# Patient Record
Sex: Male | Born: 1965 | Race: White | Hispanic: No | Marital: Married | Smoking: Former smoker
Health system: Southern US, Community
[De-identification: ages and names within clinical notes are randomized; demographics above are authoritative.]

## PROBLEM LIST (undated history)

## (undated) DIAGNOSIS — I10 Essential (primary) hypertension: Secondary | ICD-10-CM

## (undated) DIAGNOSIS — N529 Male erectile dysfunction, unspecified: Secondary | ICD-10-CM

## (undated) DIAGNOSIS — T7840XA Allergy, unspecified, initial encounter: Secondary | ICD-10-CM

## (undated) DIAGNOSIS — G4733 Obstructive sleep apnea (adult) (pediatric): Secondary | ICD-10-CM

## (undated) DIAGNOSIS — J302 Other seasonal allergic rhinitis: Secondary | ICD-10-CM

## (undated) DIAGNOSIS — G4730 Sleep apnea, unspecified: Secondary | ICD-10-CM

## (undated) DIAGNOSIS — F419 Anxiety disorder, unspecified: Secondary | ICD-10-CM

## (undated) DIAGNOSIS — K5190 Ulcerative colitis, unspecified, without complications: Secondary | ICD-10-CM

## (undated) DIAGNOSIS — M1990 Unspecified osteoarthritis, unspecified site: Secondary | ICD-10-CM

## (undated) HISTORY — DX: Male erectile dysfunction, unspecified: N52.9

## (undated) HISTORY — DX: Allergy, unspecified, initial encounter: T78.40XA

## (undated) HISTORY — PX: KNEE SURGERY: SHX244

## (undated) HISTORY — DX: Sleep apnea, unspecified: G47.30

## (undated) HISTORY — PX: TONSILLECTOMY AND ADENOIDECTOMY: SHX28

## (undated) HISTORY — DX: Essential (primary) hypertension: I10

## (undated) HISTORY — DX: Other seasonal allergic rhinitis: J30.2

## (undated) HISTORY — DX: Unspecified osteoarthritis, unspecified site: M19.90

## (undated) HISTORY — PX: COLONOSCOPY: SHX174

## (undated) HISTORY — PX: OTHER SURGICAL HISTORY: SHX169

## (undated) HISTORY — DX: Obstructive sleep apnea (adult) (pediatric): G47.33

## (undated) HISTORY — DX: Ulcerative colitis, unspecified, without complications: K51.90

## (undated) HISTORY — PX: APPENDECTOMY: SHX54

## (undated) HISTORY — DX: Anxiety disorder, unspecified: F41.9

---

## 2001-08-23 ENCOUNTER — Emergency Department (HOSPITAL_COMMUNITY): Admission: EM | Admit: 2001-08-23 | Discharge: 2001-08-23 | Payer: Self-pay

## 2001-09-01 ENCOUNTER — Encounter: Payer: Self-pay | Admitting: Internal Medicine

## 2001-09-07 ENCOUNTER — Encounter: Payer: Self-pay | Admitting: Internal Medicine

## 2001-09-07 ENCOUNTER — Encounter (INDEPENDENT_AMBULATORY_CARE_PROVIDER_SITE_OTHER): Payer: Self-pay | Admitting: Specialist

## 2001-09-07 ENCOUNTER — Ambulatory Visit (HOSPITAL_COMMUNITY): Admission: RE | Admit: 2001-09-07 | Discharge: 2001-09-07 | Payer: Self-pay | Admitting: Internal Medicine

## 2001-09-19 ENCOUNTER — Encounter: Payer: Self-pay | Admitting: Internal Medicine

## 2001-10-31 ENCOUNTER — Encounter: Payer: Self-pay | Admitting: Internal Medicine

## 2001-12-20 ENCOUNTER — Encounter: Payer: Self-pay | Admitting: Internal Medicine

## 2002-11-30 ENCOUNTER — Encounter: Payer: Self-pay | Admitting: Internal Medicine

## 2003-01-30 ENCOUNTER — Encounter: Payer: Self-pay | Admitting: Internal Medicine

## 2003-05-18 ENCOUNTER — Ambulatory Visit (HOSPITAL_BASED_OUTPATIENT_CLINIC_OR_DEPARTMENT_OTHER): Admission: RE | Admit: 2003-05-18 | Discharge: 2003-05-18 | Payer: Self-pay | Admitting: Family Medicine

## 2003-06-26 ENCOUNTER — Encounter: Payer: Self-pay | Admitting: Internal Medicine

## 2003-07-25 ENCOUNTER — Encounter: Payer: Self-pay | Admitting: Internal Medicine

## 2003-09-19 ENCOUNTER — Encounter: Payer: Self-pay | Admitting: Internal Medicine

## 2004-01-17 ENCOUNTER — Encounter: Payer: Self-pay | Admitting: Internal Medicine

## 2004-02-28 ENCOUNTER — Encounter: Payer: Self-pay | Admitting: Internal Medicine

## 2004-04-14 ENCOUNTER — Ambulatory Visit: Payer: Self-pay | Admitting: Internal Medicine

## 2004-04-23 ENCOUNTER — Ambulatory Visit: Payer: Self-pay | Admitting: Internal Medicine

## 2004-05-09 ENCOUNTER — Ambulatory Visit: Payer: Self-pay | Admitting: Internal Medicine

## 2004-05-21 ENCOUNTER — Ambulatory Visit: Payer: Self-pay | Admitting: Internal Medicine

## 2004-06-09 ENCOUNTER — Ambulatory Visit: Payer: Self-pay | Admitting: Internal Medicine

## 2004-06-10 ENCOUNTER — Encounter: Payer: Self-pay | Admitting: Internal Medicine

## 2004-06-27 ENCOUNTER — Ambulatory Visit: Payer: Self-pay | Admitting: Internal Medicine

## 2004-07-09 ENCOUNTER — Ambulatory Visit: Payer: Self-pay | Admitting: Internal Medicine

## 2004-08-06 ENCOUNTER — Ambulatory Visit: Payer: Self-pay | Admitting: Internal Medicine

## 2004-09-08 ENCOUNTER — Ambulatory Visit: Payer: Self-pay | Admitting: Internal Medicine

## 2004-10-22 ENCOUNTER — Ambulatory Visit: Payer: Self-pay | Admitting: Internal Medicine

## 2005-01-07 ENCOUNTER — Ambulatory Visit: Payer: Self-pay | Admitting: Internal Medicine

## 2005-04-15 ENCOUNTER — Ambulatory Visit: Payer: Self-pay | Admitting: Internal Medicine

## 2005-07-03 ENCOUNTER — Ambulatory Visit: Payer: Self-pay | Admitting: Internal Medicine

## 2007-04-01 ENCOUNTER — Ambulatory Visit: Payer: Self-pay | Admitting: Internal Medicine

## 2007-04-01 LAB — CONVERTED CEMR LAB
ALT: 37 units/L (ref 0–53)
AST: 22 units/L (ref 0–37)
Alkaline Phosphatase: 60 units/L (ref 39–117)
BUN: 9 mg/dL (ref 6–23)
Basophils Relative: 0.6 % (ref 0.0–1.0)
Bilirubin, Direct: 0.1 mg/dL (ref 0.0–0.3)
CO2: 30 meq/L (ref 19–32)
Calcium: 8.9 mg/dL (ref 8.4–10.5)
Chloride: 105 meq/L (ref 96–112)
Creatinine, Ser: 0.9 mg/dL (ref 0.4–1.5)
Eosinophils Relative: 5.1 % — ABNORMAL HIGH (ref 0.0–5.0)
GFR calc Af Amer: 119 mL/min
Glucose, Bld: 111 mg/dL — ABNORMAL HIGH (ref 70–99)
Lymphocytes Relative: 28.7 % (ref 12.0–46.0)
Monocytes Relative: 8.1 % (ref 3.0–11.0)
Neutro Abs: 3.2 10*3/uL (ref 1.4–7.7)
Platelets: 174 10*3/uL (ref 150–400)
RBC: 4.76 M/uL (ref 4.22–5.81)
RDW: 12.3 % (ref 11.5–14.6)
Total Protein: 6.7 g/dL (ref 6.0–8.3)
WBC: 5.6 10*3/uL (ref 4.5–10.5)

## 2007-05-26 DIAGNOSIS — K5100 Ulcerative (chronic) pancolitis without complications: Secondary | ICD-10-CM

## 2007-05-26 DIAGNOSIS — R21 Rash and other nonspecific skin eruption: Secondary | ICD-10-CM | POA: Insufficient documentation

## 2007-11-14 ENCOUNTER — Ambulatory Visit: Payer: Self-pay | Admitting: Internal Medicine

## 2008-06-21 ENCOUNTER — Emergency Department (HOSPITAL_COMMUNITY): Admission: EM | Admit: 2008-06-21 | Discharge: 2008-06-21 | Payer: Self-pay | Admitting: Emergency Medicine

## 2008-06-21 ENCOUNTER — Telehealth: Payer: Self-pay | Admitting: Internal Medicine

## 2008-10-14 ENCOUNTER — Encounter: Admission: RE | Admit: 2008-10-14 | Discharge: 2008-10-14 | Payer: Self-pay | Admitting: Family Medicine

## 2009-02-12 ENCOUNTER — Ambulatory Visit (HOSPITAL_BASED_OUTPATIENT_CLINIC_OR_DEPARTMENT_OTHER): Admission: RE | Admit: 2009-02-12 | Discharge: 2009-02-12 | Payer: Self-pay | Admitting: Orthopedic Surgery

## 2009-07-10 ENCOUNTER — Encounter (INDEPENDENT_AMBULATORY_CARE_PROVIDER_SITE_OTHER): Payer: Self-pay | Admitting: *Deleted

## 2009-07-30 ENCOUNTER — Encounter (INDEPENDENT_AMBULATORY_CARE_PROVIDER_SITE_OTHER): Payer: Self-pay

## 2009-08-02 ENCOUNTER — Ambulatory Visit: Payer: Self-pay | Admitting: Internal Medicine

## 2009-08-12 ENCOUNTER — Ambulatory Visit: Payer: Self-pay | Admitting: Internal Medicine

## 2009-08-14 ENCOUNTER — Encounter: Payer: Self-pay | Admitting: Internal Medicine

## 2009-11-25 ENCOUNTER — Emergency Department (HOSPITAL_COMMUNITY): Admission: EM | Admit: 2009-11-25 | Discharge: 2009-11-25 | Payer: Self-pay | Admitting: Emergency Medicine

## 2010-01-21 ENCOUNTER — Ambulatory Visit: Payer: Self-pay | Admitting: Internal Medicine

## 2010-01-21 DIAGNOSIS — Z860100 Personal history of colon polyps, unspecified: Secondary | ICD-10-CM | POA: Insufficient documentation

## 2010-01-21 LAB — CONVERTED CEMR LAB
Albumin: 4.2 g/dL (ref 3.5–5.2)
Alkaline Phosphatase: 60 units/L (ref 39–117)
BUN: 10 mg/dL (ref 6–23)
Basophils Relative: 0.4 % (ref 0.0–3.0)
CO2: 27 meq/L (ref 19–32)
CRP, High Sensitivity: 17.63 — ABNORMAL HIGH (ref 0.00–5.00)
Calcium: 9.4 mg/dL (ref 8.4–10.5)
Eosinophils Relative: 2.7 % (ref 0.0–5.0)
GFR calc non Af Amer: 78.39 mL/min (ref >60)
Glucose, Bld: 88 mg/dL (ref 70–99)
Hemoglobin: 15 g/dL (ref 13.0–17.0)
MCHC: 34.9 g/dL (ref 30.0–36.0)
Monocytes Absolute: 0.7 10*3/uL (ref 0.1–1.0)
Monocytes Relative: 11.2 % (ref 3.0–12.0)
Neutrophils Relative %: 59.6 % (ref 43.0–77.0)
Potassium: 4.4 meq/L (ref 3.5–5.1)
RDW: 12.9 % (ref 11.5–14.6)

## 2010-02-04 ENCOUNTER — Ambulatory Visit: Payer: Self-pay | Admitting: Internal Medicine

## 2010-06-03 NOTE — Progress Notes (Signed)
Summary: [place]  [place]   Imported By: Phillis Knack 02/05/2010 07:44:28  _____________________________________________________________________  External Attachment:    Type:   Image     Comment:   External Document

## 2010-06-03 NOTE — Progress Notes (Signed)
Summary: [place]  [place]   Imported By: Phillis Knack 02/05/2010 07:49:04  _____________________________________________________________________  External Attachment:    Type:   Image     Comment:   External Document

## 2010-06-03 NOTE — Progress Notes (Signed)
Summary: [place]  [place]   Imported By: Phillis Knack 02/05/2010 07:42:14  _____________________________________________________________________  External Attachment:    Type:   Image     Comment:   External Document

## 2010-06-03 NOTE — Progress Notes (Signed)
Summary: [place]  [place]   Imported By: Phillis Knack 02/05/2010 07:52:14  _____________________________________________________________________  External Attachment:    Type:   Image     Comment:   External Document

## 2010-06-03 NOTE — Progress Notes (Signed)
Summary: [place]  [place]   Imported By: Phillis Knack 02/05/2010 07:38:29  _____________________________________________________________________  External Attachment:    Type:   Image     Comment:   External Document

## 2010-06-03 NOTE — Consult Note (Signed)
Summary: [place]  [place]   Imported By: Phillis Knack 02/05/2010 07:30:46  _____________________________________________________________________  External Attachment:    Type:   Image     Comment:   External Document

## 2010-06-03 NOTE — Progress Notes (Signed)
Summary: [place]  [place]   Imported By: Phillis Knack 02/05/2010 07:43:08  _____________________________________________________________________  External Attachment:    Type:   Image     Comment:   External Document

## 2010-06-03 NOTE — Progress Notes (Signed)
Summary: [place]  [place]   Imported By: Phillis Knack 02/05/2010 07:41:13  _____________________________________________________________________  External Attachment:    Type:   Image     Comment:   External Document

## 2010-06-03 NOTE — Miscellaneous (Signed)
Summary: [facility] previsit  Clinical Lists Changes  Medications: Added new medication of MOVIPREP 100 GM  SOLR (PEG-KCL-NACL-NASULF-NA ASC-C) As per prep instructions. - Signed Rx of MOVIPREP 100 GM  SOLR (PEG-KCL-NACL-NASULF-NA ASC-C) As per prep instructions.;  #1 x 0;  Signed;  Entered by: Cornelia Copa RN;  Authorized by: Irene Shipper MD;  Method used: Electronically to [facility] [address] [place], [place], [place]  [address], Ph: [phone], Fax: [phone] Observations: Added new observation of ALLERGY REV: Done (08/02/2009 14:20)    Prescriptions: MOVIPREP 100 GM  SOLR (PEG-KCL-NACL-NASULF-NA ASC-C) As per prep instructions.  #1 x 0   Entered by:   Cornelia Copa RN   Authorized by:   Irene Shipper MD   Signed by:   Cornelia Copa RN on 08/02/2009   Method used:   Electronically to        [facility]       [address]       [place], [place]  [address]       Ph: [phone]       Fax: [phone]   [place]:   [phone]

## 2010-06-03 NOTE — Progress Notes (Signed)
Summary: [place]  [place]   Imported By: Phillis Knack 02/05/2010 07:51:26  _____________________________________________________________________  External Attachment:    Type:   Image     Comment:   External Document

## 2010-06-03 NOTE — Assessment & Plan Note (Signed)
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 He has had repeated problems with medical noncompliance as is well documented. Routine surveillance colonoscopy, off medical therapy, and April 2011 revealed no macroscopic or microscopic evidence of colitis diverticulosis and a diminutive adenoma present. Last seen in the office January 21, 2010 with an apparent flare of colitis after recent antibiotics. Laboratories were unremarkable except for an elevated C. reactive protein. He was treated with him. Metronidazole for one week. As well colitis medications in the form of mesalamine and 6-mercaptopurine reinitiated. He presents today for followup. He reports that he is significantly better. He estimates 65% better. Currently the for 5 soft bowel movements per day. No blood or abdominal pain. Sleeping better. Pleased. The medication issues.   GI Review of Systems      Denies abdominal pain, acid reflux, belching, bloating, chest pain, dysphagia with liquids, dysphagia with solids, heartburn, loss of appetite, nausea, vomiting, vomiting blood, weight loss, and  weight gain.        Denies anal fissure, black tarry stools, change in bowel habit, constipation, diarrhea, diverticulosis, fecal incontinence, heme positive stool, hemorrhoids, irritable bowel syndrome, jaundice, light color stool, liver problems, rectal bleeding, and  rectal pain.    Current Medications (verified): 1)  Zyzal ?mg .... 1/2-1 Tablet By Mouth Once Daily 2)  Lialda 1.2 Gm Tbec (Mesalamine) .... 2 By Mouth Two Times A Day 3)  Hydrocodone-Acetaminophen 10-325 Mg Tabs (Hydrocodone-Acetaminophen) .[facility] [facility].. 1 By Mouth  Every 6 Hours As Needed Pain 4)  Purinethol 50 Mg Tabs (Mercaptopurine) .... Take 2 Tablets By Mouth Once Daily  Allergies (verified): 1)  ! Codeine 2)  ! Sulfa  Past History:  Past Medical History: Reviewed history from 11/14/2007 and no changes required. Current Problems:  POLYP, COLON, INFLAMMATORY POLYP (ICD-211.3) SKIN RASH (ICD-782.1) COLITIS, ULCERATIVE, UNIVERSAL (ICD-556.6) Sleep Apnea  Past Surgical History: Reviewed history from 01/21/2010 and no changes required. Appendectomy Arthroscopic surgery of his left knee Shoulder surgery  Family History: Reviewed history from 11/14/2007 and no changes required. Family History of Breast Cancer:Grandmother Family History of Colon Cancer:2 family members  Social History: Occupation: Clinical cytogeneticist Patient is a former smoker.  Alcohol Use - yes( beer occ) Illicit Drug Use - no Patient does not get regular exercise.  married with 3 sons  Review of Systems       The patient complains of arthritis/joint pain.  The patient denies allergy/sinus, anemia, anxiety-new, back pain, blood in urine, breast changes/lumps, change in vision, confusion, cough, coughing up blood, depression-new, fainting, fatigue, fever, headaches-new, hearing problems, heart murmur, heart rhythm changes, itching, menstrual pain, muscle pains/cramps, night sweats, nosebleeds, pregnancy symptoms, shortness of breath, skin rash, sleeping problems, sore throat, swelling of feet/legs, swollen lymph glands, thirst - excessive , urination - excessive , urination changes/pain, urine leakage, vision changes, and voice change.    Vital Signs:  Patient profile:   45 year old male Height:      72 inches Weight:      296.50 pounds BMI:     40.36 Pulse rate:   68 / minute Pulse rhythm:   regular BP sitting:   126 / 74  (left arm) Cuff size:   large  Vitals  Entered By: June McMurray [place] Deborra Medina) (February 04, 2010 8:08 AM)  Physical Exam  General:  Well developed,  well nourished, no acute distress. Head:  Normocephalic and atraumatic. Eyes:  PERRLA, no icterus. Mouth:  No deformity or lesions, dentition normal. Neck:  Supple; no masses or thyromegaly. Lungs:  Clear throughout to auscultation. Heart:  Regular rate and rhythm; no murmurs, rubs,  or bruits. Abdomen:  Soft,obese, nontender and nondistended. No masses, hepatosplenomegaly or hernias noted. Normal bowel sounds. Msk:  no deformities Pulses:  Normal pulses noted. Extremities:  no edema Neurologic:  alert and oriented Skin:  no jaundice or rash Psych:  Alert and cooperative. Normal mood and affect, though a bit anxious.   Impression & Recommendations:  Problem # 1:  COLITIS, ULCERATIVE, UNIVERSAL (ICD-556.6) Apparent recent flare of ulcerative colitis improved after initiating mesalamine. Also initiated 6-mercaptopurine. Metronidazole prescribed by chance symptoms related to recent antibiotic exposure.  Plan: #1. Continue Lialda 2.4 g b.i.d. #2. Continue 6-mercaptopurine 100 mg daily #3. Routine office followup in 6 weeks #4. Contact the office in the interim for questions or problems.  Problem # 2:  PERSONAL HX COLONIC POLYPS (ICD-V12.72) Likely sporadic. No evidence of colitis at that time if polyps discovery and removal as well as no evidence of microscopic changes around the polyp site on biopsy, to suggest microsatellite instability. Due for routine followup in 2 years given his chronic colitis. Keep that recommendation for April 2013  Patient Instructions: 1)  Please schedule a follow-up appointment in 6  weeks.  2)  Copy sent to : Gaynelle Arabian, MD 3)  The medication list was reviewed and reconciled.  All changed / newly prescribed medications were explained.  A complete medication list was provided to the patient / caregiver.

## 2010-06-03 NOTE — Letter (Signed)
Summary: [facility] Instructions  [facility]  [place], [place] [address]   Phone: [phone]  Fax: [phone]       Francisco Russell    Mar 17, 1966    MRN: [id number]        Procedure Day /Date: Monday  08/12/09     Arrival Time: 2:00pm     Procedure Time: 3:00pm     Location of Procedure:                    _X _  [place] (4th Floor)                        [place]   Starting 5 days prior to your procedure  Wednesday 04/06  do not eat nuts, seeds, popcorn, corn, beans, peas,  salads, or any raw vegetables.  Do not take any fiber supplements (e.g. Metamucil, Citrucel, and Benefiber).  THE DAY BEFORE YOUR PROCEDURE         DATE:  04/10   DAY: Sunday  1.  Drink clear liquids the entire day-NO SOLID FOOD  2.  Do not drink anything colored red or purple.  Avoid juices with pulp.  No orange juice.  3.  Drink at least 64 oz. (8 glasses) of fluid/clear liquids during the day to prevent dehydration and help the prep work efficiently.  CLEAR LIQUIDS INCLUDE: Water Jello Ice Popsicles Tea (sugar ok, no milk/cream) Powdered fruit flavored drinks Coffee (sugar ok, no milk/cream) Gatorade Juice: apple, white grape, white cranberry  Lemonade Clear bullion, consomm, broth Carbonated beverages (any kind) Strained chicken noodle soup Hard Candy                             4.  In the morning, mix first dose of MoviPrep solution:    Empty 1 Pouch A and 1 Pouch B into the disposable container    Add lukewarm drinking water to the top line of the container. Mix to dissolve    Refrigerate (mixed solution should be used within 24 hrs)  5.  Begin drinking the prep at 5:00 p.m. The MoviPrep container is divided by 4 marks.   Every 15 minutes drink the solution down to the next mark (approximately 8 oz) until the full liter is complete.   6.  Follow completed prep with 16 oz of clear liquid of your choice  (Nothing red or purple).  Continue to drink clear liquids until bedtime.  7.  Before going to bed, mix second dose of MoviPrep solution:    Empty 1 Pouch A and 1 Pouch B into the disposable container    Add lukewarm drinking water to the top line of the container. Mix to dissolve    Refrigerate  THE DAY OF YOUR PROCEDURE      DATE:  04/11  DAY:  Monday  Beginning at  10:00 a.m. (5 hours before procedure):         1. Every 15 minutes, drink the solution down to the next mark (approx 8 oz) until the full liter is complete.  2. Follow completed prep with 16 oz. of clear liquid of your choice.    3. You may drink clear liquids until  1:00pm  (2 HOURS BEFORE PROCEDURE).   MEDICATION INSTRUCTIONS  Unless otherwise instructed, you should take regular prescription medications with a small sip of water  as early as possible the morning of your procedure.          OTHER INSTRUCTIONS  You will need a responsible adult at least 46 years of age to accompany you and drive you home.   This person must remain in the waiting room during your procedure.  Wear loose fitting clothing that is easily removed.  Leave jewelry and other valuables at home.  However, you may wish to bring a book to read or  an iPod/MP3 player to listen to music as you wait for your procedure to start.  Remove all body piercing jewelry and leave at home.  Total time from sign-in until discharge is approximately 2-3 hours.  You should go home directly after your procedure and rest.  You can resume normal activities the  day after your procedure.  The day of your procedure you should not:   Drive   Make legal decisions   Operate machinery   Drink alcohol   Return to work  You will receive specific instructions about eating, activities and medications before you leave.    The above instructions have been reviewed and explained to me by   Cornelia Copa RN  August 02, 2009 2:45 PM     I fully  understand and can verbalize these instructions _____________________________ Date _________

## 2010-06-03 NOTE — Progress Notes (Signed)
Summary: [place]  [place]   Imported By: Phillis Knack 02/05/2010 07:35:58  _____________________________________________________________________  External Attachment:    Type:   Image     Comment:   External Document

## 2010-06-03 NOTE — Progress Notes (Signed)
Summary: [place]  [place]   Imported By: Phillis Knack 02/05/2010 07:39:22  _____________________________________________________________________  External Attachment:    Type:   Image     Comment:   External Document

## 2010-06-03 NOTE — Progress Notes (Signed)
Summary: [name]  [place] Russell   Imported By: Phillis Knack 02/05/2010 07:50:29  _____________________________________________________________________  External Attachment:    Type:   Image     Comment:   External Document

## 2010-06-03 NOTE — Miscellaneous (Signed)
Summary: samples/Lialda  Clinical Lists Changes  Medications: Changed medication from LIALDA 1.2 GM TBEC (MESALAMINE) 2 by mouth two times a day to LIALDA 1.2 GM TBEC (MESALAMINE) 2 by mouth two times a day

## 2010-06-03 NOTE — Progress Notes (Signed)
Summary: [place]  [place]   Imported By: Phillis Knack 02/05/2010 07:48:01  _____________________________________________________________________  External Attachment:    Type:   Image     Comment:   External Document

## 2010-06-03 NOTE — Letter (Signed)
Summary: Previsit letter  [facility]  [place], [place] [address]   Phone: [phone]  Fax: [phone]       07/10/2009 MRN: [id number]  [place],   [address]  Dear Francisco Russell,  Welcome to the Gastroenterology Division at [facility].    You are scheduled to see a nurse for your pre-procedure visit on 08-02-09 at 2:30PM on the 3rd floor at [facility], [place] [employer].  We ask that you try to arrive at our office 15 minutes prior to your appointment time to allow for check-in.  Your nurse visit will consist of discussing your medical and surgical history, your immediate family medical history, and your medications.    Please bring a complete list of all your medications or, if you prefer, bring the medication bottles and we will list them.  We will need to be aware of both prescribed and over the counter drugs.  We will need to know exact dosage information as well.  If you are on blood thinners (Coumadin, Plavix, Aggrenox, Ticlid, etc.) please call our office today/prior to your appointment, as we need to consult with your physician about holding your medication.   Please be prepared to read and sign documents such as consent forms, a financial agreement, and acknowledgement forms.  If necessary, and with your consent, a friend or relative is welcome to sit-in on the nurse visit with you.  Please bring your insurance card so that we may make a copy of it.  If your insurance requires a referral to see a specialist, please bring your referral form from your primary care physician.  No co-pay is required for this nurse visit.     If you cannot keep your appointment, please call [phone] to cancel or reschedule prior to your appointment date.  This allows [place] the opportunity to schedule an appointment for another patient in need of care.    Thank you for choosing [facility] for your medical needs.  We  appreciate the opportunity to care for you.  Please visit [place] at our website  to learn more about our practice.                     Sincerely.                                                                                                                   The Gastroenterology Division

## 2010-06-03 NOTE — Progress Notes (Signed)
Summary: [place]  [place]   Imported By: Phillis Knack 02/05/2010 07:37:41  _____________________________________________________________________  External Attachment:    Type:   Image     Comment:   External Document

## 2010-06-03 NOTE — Progress Notes (Signed)
Summary: [place]  [place]   Imported By: Phillis Knack 02/05/2010 07:40:18  _____________________________________________________________________  External Attachment:    Type:   Image     Comment:   External Document

## 2010-06-03 NOTE — Progress Notes (Signed)
Summary: [place]  [place]   Imported By: Phillis Knack 02/05/2010 07:46:18  _____________________________________________________________________  External Attachment:    Type:   Image     Comment:   External Document

## 2010-06-03 NOTE — Progress Notes (Signed)
Summary: [place]  [place]   Imported By: Phillis Knack 02/05/2010 07:36:50  _____________________________________________________________________  External Attachment:    Type:   Image     Comment:   External Document

## 2010-06-03 NOTE — Letter (Signed)
Summary: Out of Work  [facility]  [address] [place], [place] [address]   Phone: [phone]  Fax: [phone]    01/21/2010  TO: WHOM IT MAY CONCERN  RE: [name] [name]       The above named individual is currently under my care and will be out of work    FROM: 01/20/2010   THROUGH:01/24/2010    REASON:  Colitis Flare    MAY RETURN ON:01/27/2010     If you have any further questions or need additional information, please call.     Sincerely,    Francisco Chuck. [name], Francisco Russell typed by: Randye Lobo NCMA

## 2010-06-03 NOTE — Progress Notes (Signed)
Summary: [place]  [place]   Imported By: Phillis Knack 02/05/2010 07:45:22  _____________________________________________________________________  External Attachment:    Type:   Image     Comment:   External Document

## 2010-06-03 NOTE — Procedures (Signed)
Summary: EGD    EGD  Procedure date:  09/07/2001  Findings:      Location: [facility]  Findings: Normal   Patient Name: Francisco Russell, Francisco Russell MRN: [id number] Procedure Procedures: Panendoscopy (EGD) CPT: 30940.  Personnel: Endoscopist: Docia Chuck. [name], MD.  Referred By: Jory Ee Sherren Mocha, MD.  Exam Location: Exam performed in Endoscopy Suite.  Patient Consent: Procedure, Alternatives, Risks and Benefits discussed, consent obtained,  Indications Symptoms: Abdominal pain, location: diffuse.  History  Pre-Exam Physical: Performed Sep 07, 2001  Entire physical exam was normal.  Exam Exam Info: Maximum depth of insertion Duodenum, intended Duodenum. Patient position: on left side. Vocal cords visualized. Gastric retroflexion performed. Images taken. ASA Classification: I. Tolerance: excellent.  Sedation Meds: Residual sedation present from prior procedure today. Demerol 30 mg. given IV. Versed 3 mg. given IV.  Monitoring: BP and pulse monitoring done. Oximetry used. Supplemental O2 given  Fluoroscopy: Fluoroscopy was not used.  Findings Normal: Proximal Esophagus to Duodenal 2nd Portion.   Assessment Normal examination.  Events  Unplanned Intervention: No unplanned interventions were required.  Unplanned Events: There were no complications. Plans Comments: see colon report Disposition: After procedure patient sent to recovery. After recovery patient sent home.  Comments: see colon report  This report was created from the original endoscopy report, which was reviewed and signed by the above listed endoscopist.   cc:  Stevie Kern, MD

## 2010-06-03 NOTE — Progress Notes (Signed)
Summary: [place]  [place]   Imported By: Phillis Knack 02/05/2010 07:34:56  _____________________________________________________________________  External Attachment:    Type:   Image     Comment:   External Document

## 2010-06-03 NOTE — Procedures (Signed)
Summary: Colonoscopy  Patient: Francisco Russell Note: All result statuses are Final unless otherwise noted.  Tests: (1) Colonoscopy (COL)   COL Colonoscopy           [facility].     [place], [place]  [address]           COLONOSCOPY PROCEDURE REPORT           PATIENT:  Francisco Russell, Francisco Russell  MR#:  [id number]     BIRTHDATE:  1966/01/05, 44 yrs. old  GENDER:  male     ENDOSCOPIST:  Docia Chuck. Geri Seminole, MD     REF. BY:  .Direct / Self     PROCEDURE DATE:  08/12/2009     PROCEDURE:  Colonoscopy with snare polypectomy x2     Colonoscopy with biopsies     ASA CLASS:  Class II     INDICATIONS:  evaluation of ulcerative colitis ; Universal.dx     09-2001. Has not been seen since 11-2007. Has discontinued his     medications; Denies symptoms     MEDICATIONS:   Fentanyl 75 mcg IV, Versed 10 mg IV           DESCRIPTION OF PROCEDURE:   After the risks benefits and     alternatives of the procedure were thoroughly explained, informed     consent was obtained.  Digital rectal exam was performed and     revealed no abnormalities.   The LB CF-[id number] endoscope     was introduced through the anus and advanced to the cecum, which     was identified by both the appendix and ileocecal valve, without     limitations.Time to cecum = 2:23 min.  The quality of the prep was     excellent, using MoviPrep.  The instrument was then slowly     withdrawn (time = 22:07 min) as the colon was fully examined.     <<PROCEDUREIMAGES>>           FINDINGS:  Two polyps were found in the mid transverse colon; a     64m hyperplastic appearring polyp and a 575madenomatous appearring     pedunculated polyp. Polyps were snared without cautery. Retrieval     was successful. 4Q bx around adenomatous polyp taken. Mild     diverticulosis was found in the sigmoid colon.  The terminal ileum     appeared normal.  This was otherwise a normal examination of the     colon with no evidence of active colitis  or chronic changes.     Retroflexed views in the rectum revealed no abnormalities.  4Q bx     taken q 10cm (N=32).  The scope was then withdrawn from the     patient and the procedure completed.           COMPLICATIONS:  None     ENDOSCOPIC IMPRESSION:     1) Two polyps in the mid transverse colon - removed     2) Mild diverticulosis in the sigmoid colon     3) Normal terminal ileum     4) Otherwise normal examination without active colitis           RECOMMENDATIONS:     1) Follow up colonoscopy in 2 years if no dysplasia on biopsies     2) RECOMMEND RESUMING ASACOL 2.4GM DAILY AS THIS MAY REDUCE THE  RISK OF COLITIS FLARE AND / OR COLON CANCER           ______________________________     Docia Chuck. Geri Seminole, MD           CC:  Gaynelle Arabian, MD; The Patient           n.     eSIGNED:   Docia Chuck. Geri Seminole at 08/12/2009 04:16 PM           Markus Jarvis, [phone]  Note: An exclamation mark (!) indicates a result that was not dispersed into the flowsheet. Document Creation Date: 08/12/2009 4:17 PM _______________________________________________________________________  (1) Order result status: Final Collection or observation date-time: 08/12/2009 16:02 Requested date-time:  Receipt date-time:  Reported date-time:  Referring Physician:   Ordering Physician: Lavena Bullion [phone]) Specimen Source:  Source: Tawanna Cooler Order Number: [id number] Lab site:   Appended Document: Colonoscopy     Procedures Next Due Date:    Colonoscopy: 08/2011

## 2010-06-03 NOTE — Letter (Signed)
Summary: Patient Notice- Colon Biospy Results  [facility]  [address] [place], [place] [address]   Phone: [phone]  Fax: [phone]        August 14, 2009 MRN: [id number]    [place], [place]  [address]    Dear Mr. Hendley,  I am pleased to inform you that the biopsies taken during your recent colonoscopy did NOT show any evidence of active colitis, dysplasia, or cancer upon pathologic examination.  The colon polyp that was removed was benign, but it was the precancerous adenoma type.   Additional information/recommendations:  __No further action is needed at this time.  Please follow-up with      your primary care physician for your other healthcare needs.  __Please call [phone] to schedule a return visit in ONE year to review      your condition.  __Continue with the treatment plan as outlined on the day of your      exam.  __You should have a repeat colonoscopy examination for this problem           in 2 years.   Please call us if you are having persistent problems or have questions about your condition that have not been fully answered at this time.  Sincerely,  Irene Shipper MD   This letter has been electronically signed by your physician.  Appended Document: Patient Notice- Colon Biospy Results letter mailed 4.18.11

## 2010-06-03 NOTE — Progress Notes (Signed)
Summary: [place]   Imported By: Phillis Knack 02/05/2010 07:33:15  _____________________________________________________________________  External Attachment:    Type:   Image     Comment:   External Document

## 2010-06-03 NOTE — Procedures (Signed)
Summary: Colonoscopy   Colonoscopy  Procedure date:  09/07/2001  Findings:      Results: Colitis.       Location:  [facility].   Patient Name: Francisco Russell, Francisco Russell MRN: [id number] Procedure Procedures: Colonoscopy CPT: 24469.    with multiple biopsies. CPT: 708 142 3161. There were greater than 10 biopsies taken.  Personnel: Endoscopist: Docia Chuck. [name], MD.  Referred By: Jory Ee Sherren Mocha, MD.  Exam Location: Exam performed in Endoscopy Suite.  Patient Consent: Procedure, Alternatives, Risks and Benefits discussed, consent obtained,  Indications Symptoms: Diarrhea Hematochezia. Abdominal pain / bloating.  History  Pre-Exam Physical: Performed Sep 07, 2001. Entire physical exam was normal.  Exam Exam: Extent of exam reached: Ileum, extent intended: Terminal Ileum.  The cecum was identified by appendiceal orifice and IC valve. Patient position: on left side. Colon retroflexion performed. Images taken. ASA Classification: I. Tolerance: excellent.  Monitoring: Pulse and BP monitoring, Oximetry used. Supplemental O2 given.  Colon Prep Used Golytely for colon prep. Prep results: excellent.  Fluoroscopy: Fluoroscopy was not used.  Sedation Meds: Demerol 100 mg. given IV. Versed 10 mg. given IV.  Findings NORMAL EXAM: Ileum. Biopsy/Normal Exam taken.  ULCERATIVE COLITIS: Cecum to Rectum. established. Erythema present. Vascular pattern absent. Granularity present, Haustral folds diminished. Friability: mild. Activity level moderate, Endoscopic Extent of Disease: Pancolitis. superficial ulcers present. Biopsy/Mucosal Abn. taken. Path # 1. ICD9: Ulcerative Colitis, Universal: 556.6.   Assessment Abnormal examination, see findings above.  Diagnoses: 556.6: Ulcerative Colitis, Universal.   Events  Unplanned Interventions: No intervention was required.  Unplanned Events: There were no complications. Plans Medication Plan: 5-ASA: Mesalamine 1645m TID, starting  Sep 07, 2001  5-ASA: Mesalamine Enema HS, starting Sep 07, 2001   Patient Education: Patient given standard instructions for: Colitis.  Disposition: After procedure patient sent to recovery. After recovery patient sent home.  Scheduling/Referral: Office Visit, to [employer] PHenrene Pastor MD, Monday May 19th @ 9:15am.,    This report was created from the original endoscopy report, which was reviewed and signed by the above listed endoscopist. '  cc:  JStevie Kern MD

## 2010-06-03 NOTE — Progress Notes (Signed)
Summary: [facility]   Imported By: Phillis Knack 02/05/2010 07:32:04  _____________________________________________________________________  External Attachment:    Type:   Image     Comment:   External Document

## 2010-06-03 NOTE — Assessment & Plan Note (Signed)
Summary: Colitis flare   History of Present Illness Primary GI MD: Scarlette Shorts MD Primary Provider: Gaynelle Arabian, MD Chief Complaint: Colits flare up with severe abd pain, loose stools with BRB. Pt states he was recently put on ATB's and thinks the medications made him flare up. 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 He has had repeated problems with medical noncompliance as well documented. He was last seen April 2011 for routine surveillance colonoscopy. The colonic mucosa was grossly normal. Mild sigmoid diverticulosis noted. The ileum was normal. 2 small polyps, one adenomatous, removed. Multiple biopsies of the colon throughout were normal. Biopsies around the diminutive adenoma were normal. He and his wife were advised to resume mesalamine therapy to reduce the risk of colitis flare and/or colon cancer. He did not. He tells me that he was well until about 3 weeks ago when he began to exhibit signs and symptoms of atypical colitis flare. The week prior, he began antibiotics for a tooth abscess. Symptoms have persisted and include intermittent abdominal pain, diarrhea with blood and mucus, decreased appetite, and weight loss. He has used old hydrocodone to control the pain and help him rest at night. She does not distinguish his current symptoms from previous flares of his colitis. He is now interested in medical therapy and specifically hopes to get back on 6-mercaptopurine. At one point he was doing well on a combination of mesalamine and 6-mercaptopurine. His initial dose of 6-mercaptopurine was decreased from 150 mg daily (based on body weight) 100 mg daily as there was a questionable reaction. No problems on the 100 mg daily dose.   GI Review of Systems    Reports abdominal pain and  weight loss.     Location of  Abdominal pain: generalized. Weight loss of 19 pounds pounds over 3 weeks.   Denies acid reflux,  belching, bloating, chest pain, dysphagia with liquids, dysphagia with solids, heartburn, loss of appetite, nausea, vomiting, vomiting blood, and  weight gain.      Reports diarrhea, rectal bleeding, and  rectal pain.     Denies anal fissure, black tarry stools, change in bowel habit, constipation, diverticulosis, fecal incontinence, heme positive stool, hemorrhoids, irritable bowel syndrome, jaundice, light color stool, and  liver problems.    Current Medications (verified): 1)  Zyzal ?mg .... 1/2-1 Tablet By Mouth Once Daily  Allergies (verified): 1)  ! Codeine 2)  ! Sulfa  Past History:  Past Medical History: Reviewed history from 11/14/2007 and no changes required. Current Problems:  POLYP, COLON, INFLAMMATORY POLYP (ICD-211.3) SKIN RASH (ICD-782.1) COLITIS, ULCERATIVE, UNIVERSAL (ICD-556.6) Sleep Apnea  Past Surgical History: Appendectomy Arthroscopic surgery of his left knee Shoulder surgery  Family History: Reviewed history from 11/14/2007 and no changes required. Family History of Breast Cancer:Grandmother Family History of Colon Cancer:2 family members  Social History: Reviewed history from 11/14/2007 and no changes required. Occupation:  Patient is a former smoker.  Alcohol Use - yes( beer occ) Illicit Drug Use - no Patient does not get regular exercise.  married with 3 sons  Review of Systems  The patient denies allergy/sinus, anemia, anxiety-new, arthritis/joint pain, back pain, blood in urine, breast changes/lumps, change in vision, confusion, cough, coughing up blood, depression-new, fainting, fatigue, fever, headaches-new, hearing problems, heart murmur, heart rhythm changes, itching, menstrual pain, muscle pains/cramps, night sweats, nosebleeds, pregnancy symptoms, shortness of breath, skin rash, sleeping problems, sore throat, swelling of feet/legs, swollen lymph  glands, thirst - excessive , urination - excessive , urination changes/pain, urine leakage,  vision changes, and voice change.    Vital Signs:  Patient profile:   45 year old male Height:      72 inches Weight:      294.50 pounds BMI:     40.09 Pulse rate:   80 / minute Pulse rhythm:   regular BP sitting:   128 / 72  (right arm) Cuff size:   large  Vitals Entered By: Marlon Pel CMA Deborra Medina) (January 21, 2010 11:15 AM)  Physical Exam  General:  Well developed, obese,well nourished, no acute distress. Head:  Normocephalic and atraumatic. Eyes:  PERRLA, no icterus. Ears:  Normal auditory acuity. Nose:  No deformity, discharge,  or lesions. Mouth:  No deformity or lesions, dentition normal. Neck:  Supple; no masses or thyromegaly. Lungs:  Clear throughout to auscultation. Heart:  Regular rate and rhythm; no murmurs, rubs,  or bruits. Abdomen:  Soft, obese,nontender and nondistended. No masses, hepatosplenomegaly or hernias noted. Normal bowel sounds. Msk:  Symmetrical with no gross deformities. Normal posture. Pulses:  Normal pulses noted. Extremities:  No clubbing, cyanosis, edema or deformities noted. Neurologic:  Alert and  oriented x4;  grossly normal neurologically. Skin:  Intact without significant lesions or rashes. Psych:  Alert and cooperative. Normal mood and affect.   Impression & Recommendations:  Problem # 1:  COLITIS, ULCERATIVE, UNIVERSAL (ICD-556.6) The patient presents today with what appears to be a flare of his ulcerative colitis. Initially diagnosed with universal ulcerative colitis in 2003. Has been off all medical therapy, including at the time of recent colonoscopy in April 2011. No evidence of macroscopic or microscopic colitis at that time. Recent course of antibiotics immediately preceding flare of colitis. Could be C. difficile, but suspect primary colitis flare. As always, his condition reviewed, medications and therapies available reviewed, and the importance of compliance stressed. He is not interested and prednisone due to its affects on  him in terms of weight gain and irritability.  Plan: #1. Laboratories including CBC, comprehensive metabolic panel, and C-reactive protein #2. Initiate mesalamine in the form of Lialda 2.4 g b.i.d. Samples as well as a prescription with multiple refills and a rebate coupon provided #3. Empiric trial of metronidazole 250 mg q.i.d. x1 week. Prescription submitted #4. Reinitiate 6-mercaptopurine 100 mg daily. We reviewed potential side effects and the need for periodic blood work. Prescription submitted. He understands that it may take several months for this medication to have affect #5. Limited supply of hydrocodone 10/325 mg #40 prescribed. One p.o. q.6 hours p.r.n. No refills #6. Work note, per patient request, to be off this week. Provided. #7. Followup office visit in 2 weeks. Contact the office in the interim for problems or questions.  Problem # 2:  PERSONAL HX COLONIC POLYPS (ICD-V12.72) seems to be sporadic. No evidence of colitis at that time and no evidence of abnormal changes around the polyp site to suggest microsatellite instability. Due for followup in 2 years given chronic colitis. Keep that recommendation  Other Orders: TLB-CBC Platelet - w/Differential (85025-CBCD) TLB-CMP (Comprehensive Metabolic Pnl) (84665-LDJT) TLB-CRP-High Sensitivity (C-Reactive Protein) (86140-FCRP)  Patient Instructions: 1)  Lialda 1.2 gm #120 2 by mouth two times a day x 11 RFs. 2)  Metronidazole 250 mg #28 1 by mouth four times per day. 3)  Hydrocodone 10/325 mg #40 1 by mouth every 6 hours as needed pain 4)  6 MP 50 mg 2 by mouth once daily # 60  x 3 RFS. 5)  Work note given to patient. 6)  Labs ordered for you to go to basement floor today and have drawn. 7)  Appt. 02/04/10 8:15 am.  8)  Copy sent to : Gaynelle Arabian, MD 9)  The medication list was reviewed and reconciled.  All changed / newly prescribed medications were explained.  A complete medication list was provided to the patient /  caregiver. Prescriptions: PURINETHOL 50 MG TABS (MERCAPTOPURINE) take 2 tablets by mouth once daily  #60 x 3   Entered by:   Randye Lobo NCMA   Authorized by:   Irene Shipper MD   Signed by:   Randye Lobo NCMA on 01/21/2010   Method used:   Electronically to        [facility] (retail)       [address]       [place], [place]  [address]       Ph: [phone]       Fax: [phone]   RxID:   [id number] HYDROCODONE-ACETAMINOPHEN 10-325 MG TABS (HYDROCODONE-ACETAMINOPHEN) 1 by mouth every 6 hours as needed pain  #40 x 0   Entered by:   Randye Lobo NCMA   Authorized by:   Irene Shipper MD   Signed by:   Randye Lobo NCMA on 01/21/2010   Method used:   Printed then faxed to ...       [facility]       [address]       [place], [place]  [address]       Ph: [phone]       Fax: [phone]   RxID:   [id number] METRONIDAZOLE 250 MG TABS (METRONIDAZOLE) 1 by mouth four times per day  #28 x 0   Entered by:   Randye Lobo NCMA   Authorized by:   Irene Shipper MD   Signed by:   Randye Lobo NCMA on 01/21/2010   Method used:   Electronically to        [facility] ([facility])       [address]       [place], [place]  [address]       Ph: [phone]       Fax: [phone]   RxID:   [id number] LIALDA 1.2 GM TBEC (MESALAMINE) 2 by mouth two times a day  #120 x 11   Entered by:   Randye Lobo NCMA   Authorized by:   Irene Shipper MD   Signed by:   Randye Lobo NCMA on 01/21/2010   Method used:   Electronically to        [facility] ([facility])       [address]       [place],   [address]       Ph: [phone]       Fax: [phone]   [place]:   [phone]

## 2010-07-19 LAB — URINALYSIS, ROUTINE W REFLEX MICROSCOPIC
Bilirubin Urine: NEGATIVE
Ketones, ur: NEGATIVE mg/dL
Nitrite: NEGATIVE
Protein, ur: 100 mg/dL — AB
Urobilinogen, UA: 1 mg/dL (ref 0.0–1.0)
pH: 6.5 (ref 5.0–8.0)

## 2010-07-19 LAB — URINE CULTURE
Colony Count: NO GROWTH
Culture: NO GROWTH

## 2010-07-19 LAB — POCT I-STAT, CHEM 8
HCT: 46 % (ref 39.0–52.0)
Hemoglobin: 15.6 g/dL (ref 13.0–17.0)
Potassium: 4.7 mEq/L (ref 3.5–5.1)
Sodium: 140 mEq/L (ref 135–145)
TCO2: 26 mmol/L (ref 0–100)

## 2010-08-19 LAB — COMPREHENSIVE METABOLIC PANEL
ALT: 47 U/L (ref 0–53)
AST: 36 U/L (ref 0–37)
Albumin: 3.9 g/dL (ref 3.5–5.2)
CO2: 23 mEq/L (ref 19–32)
Calcium: 8.9 mg/dL (ref 8.4–10.5)
Chloride: 104 mEq/L (ref 96–112)
Creatinine, Ser: 1.05 mg/dL (ref 0.4–1.5)
GFR calc Af Amer: >60 mL/min (ref >60)
GFR calc non Af Amer: >60 mL/min (ref >60)
Sodium: 137 mEq/L (ref 135–145)

## 2010-08-19 LAB — URINALYSIS, ROUTINE W REFLEX MICROSCOPIC
Bilirubin Urine: NEGATIVE
Glucose, UA: NEGATIVE mg/dL
Ketones, ur: NEGATIVE mg/dL
Protein, ur: NEGATIVE mg/dL
pH: 6.5 (ref 5.0–8.0)

## 2010-08-19 LAB — CBC
MCHC: 35.8 g/dL (ref 30.0–36.0)
Platelets: 204 10*3/uL (ref 150–400)
RBC: 4.4 MIL/uL (ref 4.22–5.81)
WBC: 6.6 10*3/uL (ref 4.0–10.5)

## 2010-08-19 LAB — URINE MICROSCOPIC-ADD ON

## 2010-08-19 LAB — DIFFERENTIAL
Eosinophils Absolute: 0.2 10*3/uL (ref 0.0–0.7)
Eosinophils Relative: 3 % (ref 0–5)
Lymphocytes Relative: 28 % (ref 12–46)
Lymphs Abs: 1.8 10*3/uL (ref 0.7–4.0)
Monocytes Absolute: 0.4 10*3/uL (ref 0.1–1.0)

## 2010-09-16 NOTE — Assessment & Plan Note (Signed)
[facility]                         GASTROENTEROLOGY OFFICE NOTE   Francisco, Russell                         MRN:          [id number]  DATE:04/01/2007                            DOB:          08/11/1965    HISTORY OF PRESENT ILLNESS:  Francisco Russell scheduled himself today for a  followup appointment which he is 14 months overdue.  He has a history of  universal ulcerative colitis diagnosed in May 2003.  His disease was  difficult to control and he became steroid-dependent.  For this he was  placed on Purinethol and maintained on Asacol.  His disease subsequently  came under control and steroids were taperred off.  He was last  evaluated in the office on July 03, 2005.  At that time he was on  Purinethol 100 mg daily and Asacol 4.8 g daily.  He was to continue his  medications without change.  CBC that day was stable.  He was to  continue with CBC's every three months and follow up in the office in  six months.  However, without notification, the patient decided on his  own to discontinue all medications shortly after his last office visit.  He states that he was feeling so well he did not feel like he needed to  be on medications.  He acknowledges that he was instructed regarding the  importance of maintenance therapy.  He states that he presents now just  to touch base.  Also he states he has family history of colon cancer in  two second degree cousins, and wonders about having a colonoscopy  periodically.  Thankfully the patient remains asymptomatic.  He reports  two formed bowel movements per day.  No bleeding, abdominal pain or  mucus.  He denies any extraintestinal manifestations of inflammatory  bowel disease.  He has had no interval health problems.   CURRENT MEDICATIONS:  He is currently on no medications.   ALLERGIES:  He is allergic or intolerant to SULFA and CODEINE.   PHYSICAL EXAMINATION:  GENERAL:  Today finds a well-appearing male in no  acute distress.  VITAL SIGNS:  Blood pressure 132/80, heart rate 100 and regular, weight  286.2 pounds.  HEENT:  Sclerae anicteric, conjunctivae are pink, oral mucosa intact.  NECK:  No adenopathy.  LUNGS:  Clear.  HEART:  Regular.  ABDOMEN:  Obese and soft without tenderness, masses or hernia.  Good  bowel sounds heard.  EXTREMITIES:  Without edema.   IMPRESSION:  1. History of steroid-dependent universal ulcerative colitis currently      asymptomatic off medical therapy (his choice) for 18 months.  2. Medical noncompliance and lost to followup as described above.   RECOMMENDATIONS:  We discussed today the importance of maintenance  medication.  He is willing to resume mesalamine therapy.  We will  prescribe 2.4 g p.o. b.i.d.  He is not willing to resume Purinethol  stating he is concerned about potential side effects.  I discussed with  him the potential for severe flares.  He understands and accepts the  risk.  Also we would like to  check routine blood work today including  CBC, comprehensive metabolic panel.  Finally, I would like to see him  for routine followup in about six months.  We should plan on followup  colonoscopy at that time.  His last colonoscopy was about three years  ago.  If he has interval questions or problems he certainly knows to  contact the office.     Docia Chuck. [name], MD  Electronically Signed    JNP/MedQ  DD: 04/01/2007  DT: 04/01/2007  Job #: [id number]   cc:   Modena Jansky. Marisue Humble, M.D.

## 2010-10-11 IMAGING — US US SCROTUM
1 series · 14 of 25 positions shown · non-contrast
Comparison: None

06/22/2008 - DUPLICATE COPY for exam association in [facility] – No change from original report.
CLINICAL DATA: Right-sided scrotal pain.

 SCROTAL ULTRASOUND
 DOPPLER ULTRASOUND OF THE TESTICLES
TECHNIQUE: Complete ultrasound examination of the testicles,
 epididymis, and other scrotal structures was performed. Color and
 spectral Doppler ultrasound were also utilized to evaluate blood
 flow to the testicles.

[Series 1: us scrotum · 0.07mm/px · 14 of 60 slices shown]
[im 1/60]
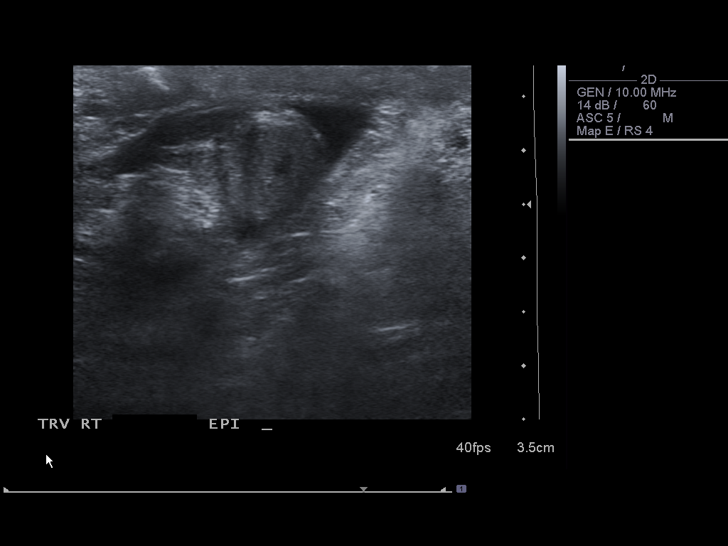
[im 5/60]
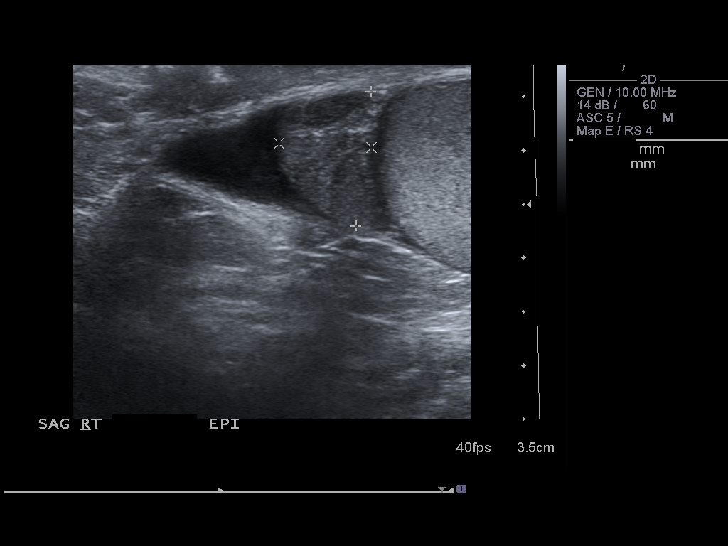
[im 10/60]
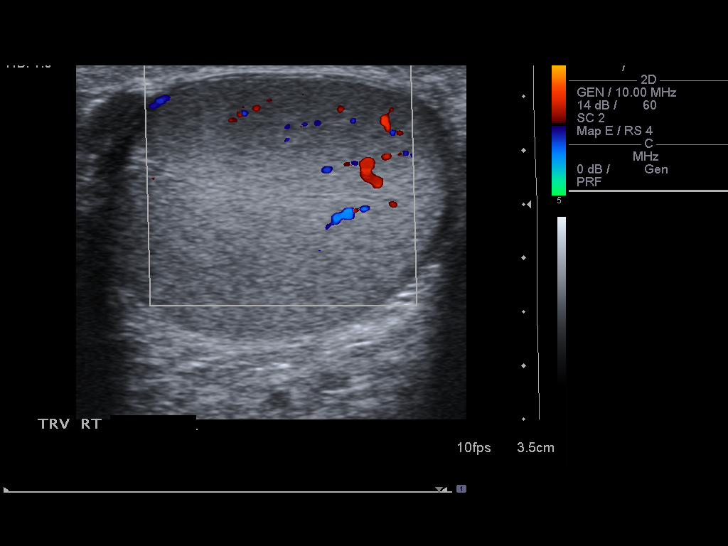
[im 15/60]
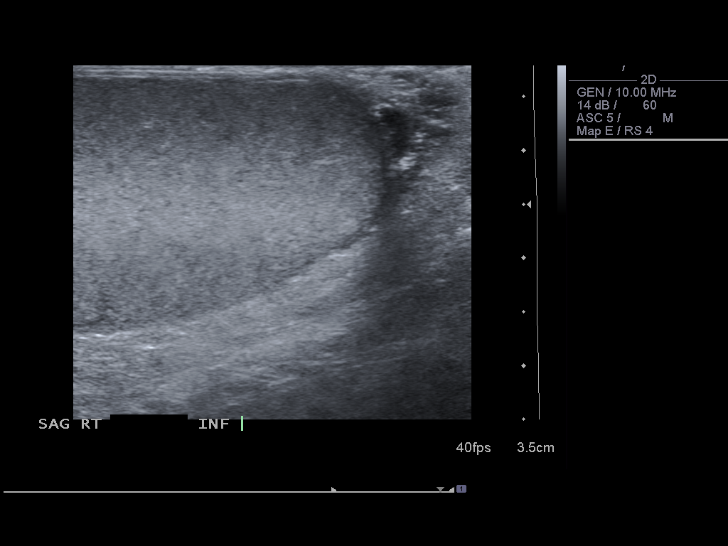
[im 20/60]
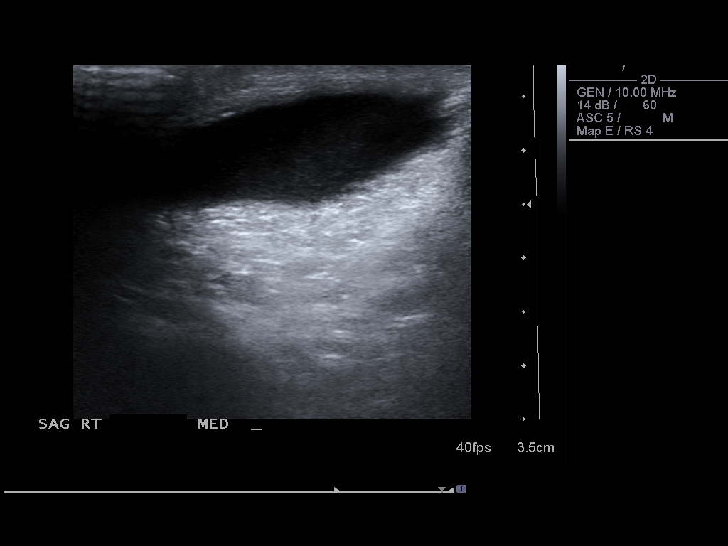
[im 23/60]
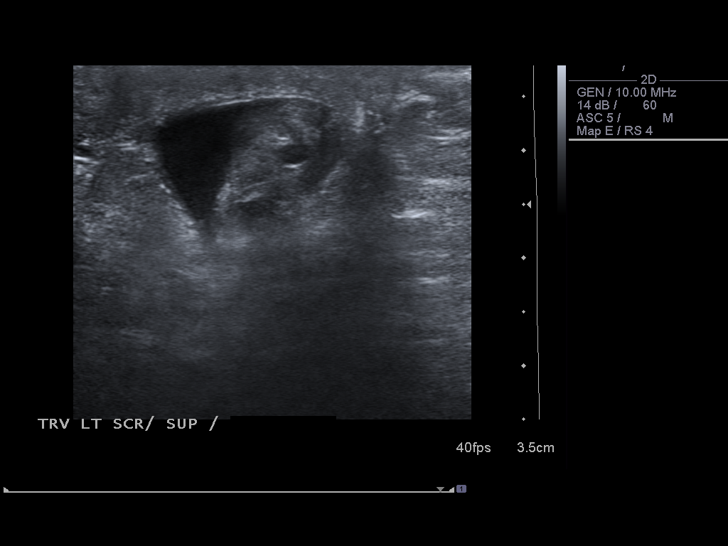
[im 28/60]
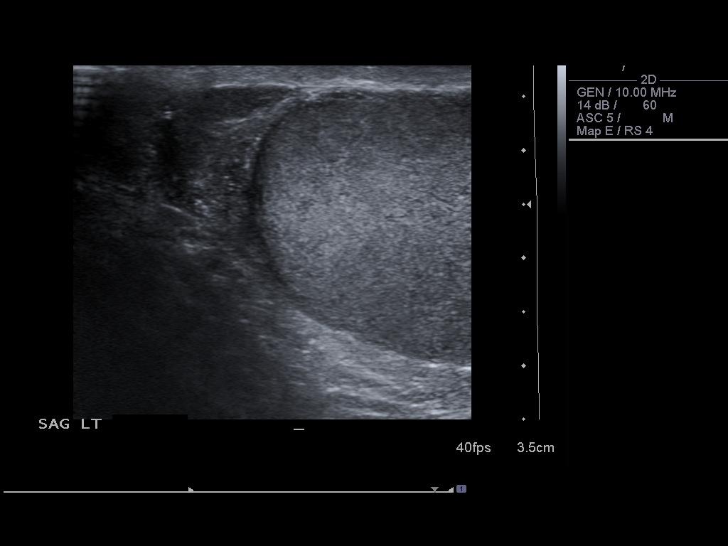
[im 32/60]
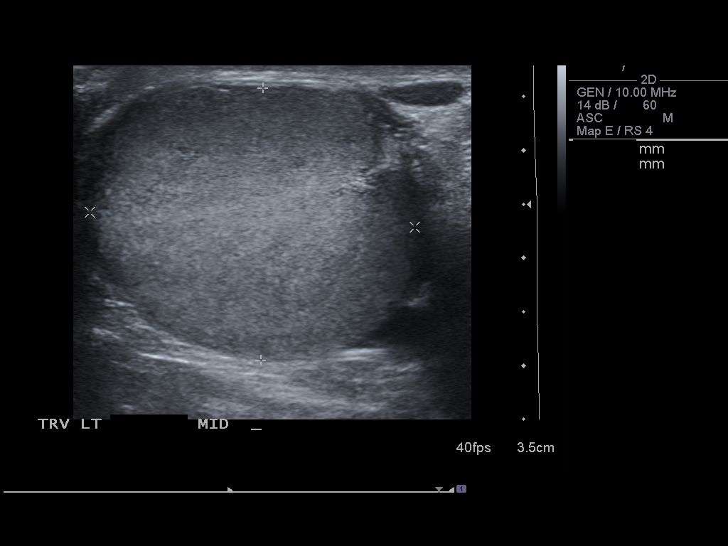
[im 37/60]
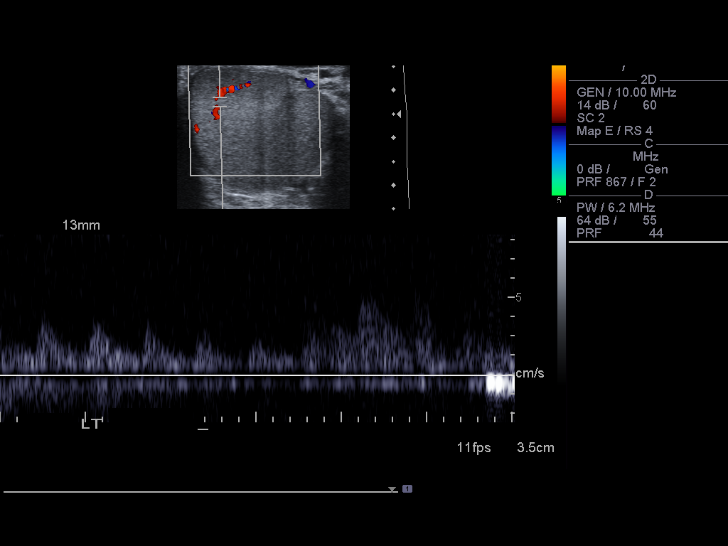
[im 40/60]
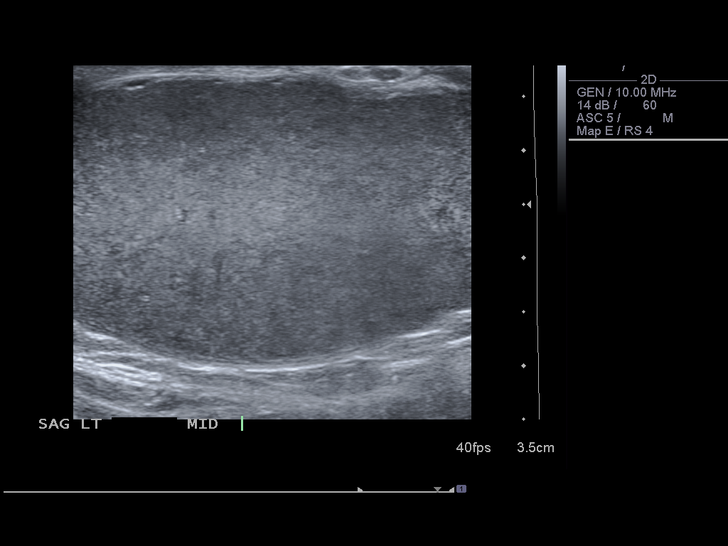
[im 45/60]
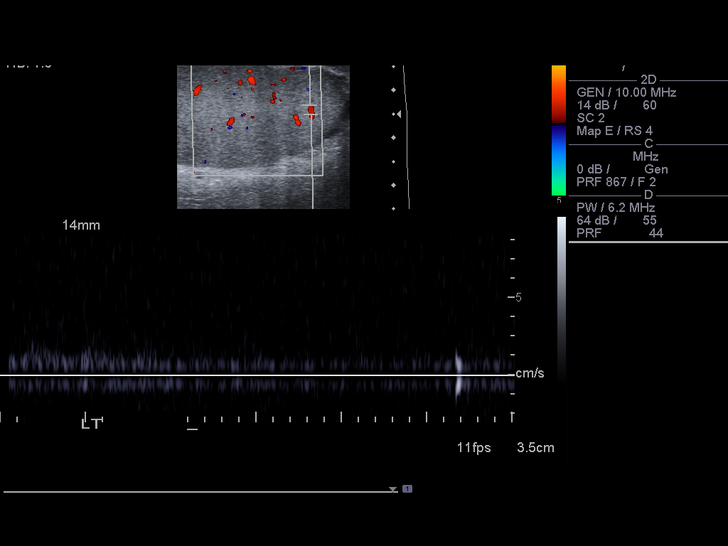
[im 50/60]
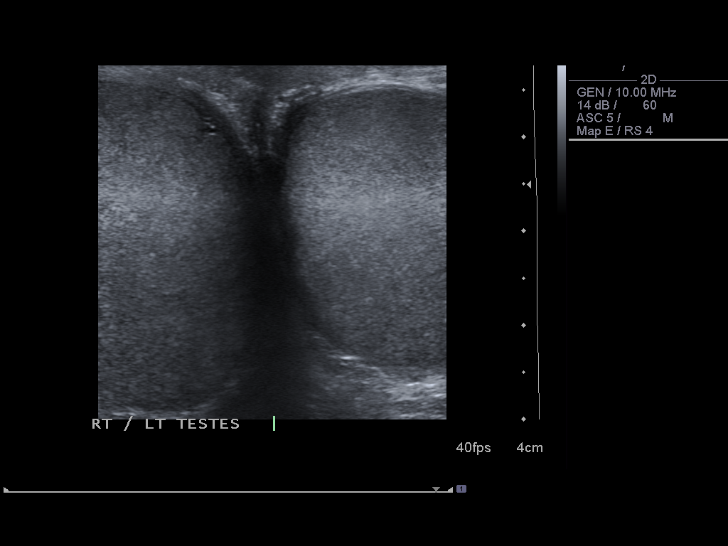
[im 55/60]
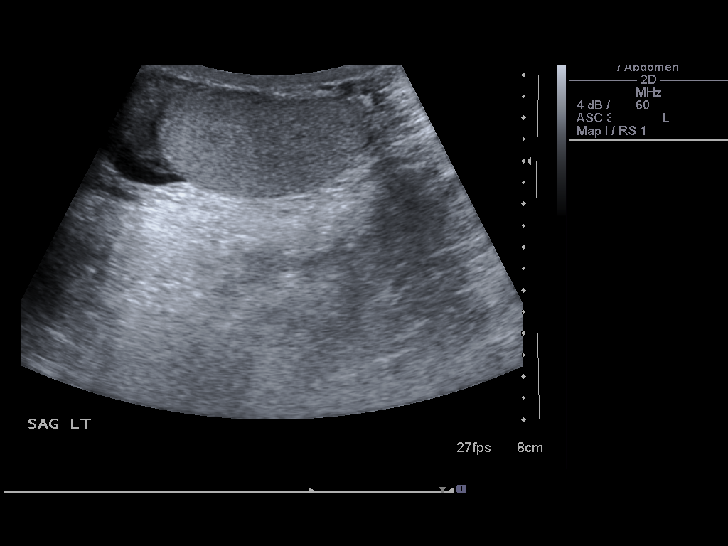
[im 60/60]
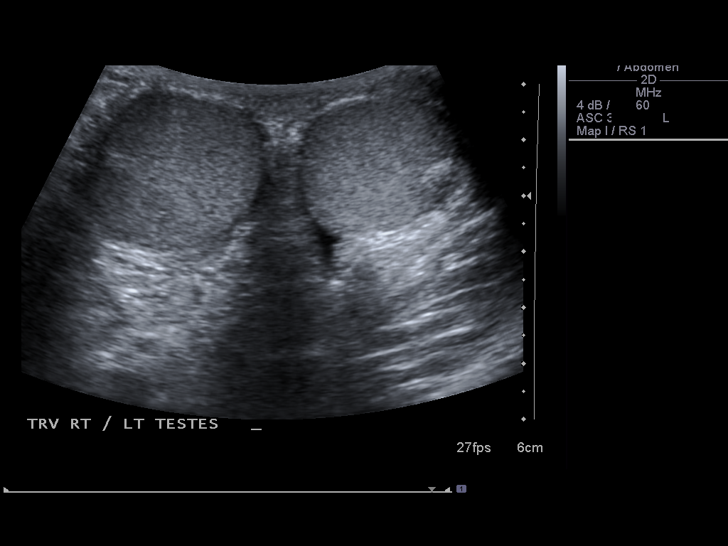

[14 of 25 positions shown; findings below may reference images not displayed]

FINDINGS: Both testicles are symmetric in size and echogenicity.
 No testicular masses are identified. Blood flow is seen within
 both testicles, and appears symmetric on color Doppler ultrasound.

 Both right and left epididymi are symmetric in size and appearance.
 There is no definite evidence of increased blood flow within either
 epididymis. Small bilateral hydroceles are noted. No other
 extratesticular abnormality identified.

 Spectral Doppler evaluation of both testicles was performed. This
 shows the presence of both arterial and venous waveforms within
 both testicles.
IMPRESSION: 1. No evidence of testicular mass or torsion.
 2. Small bilateral hydroceles.

## 2011-01-20 ENCOUNTER — Ambulatory Visit (HOSPITAL_BASED_OUTPATIENT_CLINIC_OR_DEPARTMENT_OTHER)
Admission: RE | Admit: 2011-01-20 | Discharge: 2011-01-20 | Disposition: A | Discharge status: Home / Self Care | Payer: 59 | Source: Ambulatory Visit | Attending: Orthopedic Surgery | Admitting: Orthopedic Surgery

## 2011-01-20 DIAGNOSIS — M23305 Other meniscus derangements, unspecified medial meniscus, unspecified knee: Secondary | ICD-10-CM | POA: Insufficient documentation

## 2011-01-20 DIAGNOSIS — G4733 Obstructive sleep apnea (adult) (pediatric): Secondary | ICD-10-CM | POA: Insufficient documentation

## 2011-01-20 DIAGNOSIS — Z01810 Encounter for preprocedural cardiovascular examination: Secondary | ICD-10-CM | POA: Insufficient documentation

## 2011-01-20 DIAGNOSIS — M23302 Other meniscus derangements, unspecified lateral meniscus, unspecified knee: Secondary | ICD-10-CM | POA: Insufficient documentation

## 2011-01-20 DIAGNOSIS — M2240 Chondromalacia patellae, unspecified knee: Secondary | ICD-10-CM | POA: Insufficient documentation

## 2011-01-20 DIAGNOSIS — Z01812 Encounter for preprocedural laboratory examination: Secondary | ICD-10-CM | POA: Insufficient documentation

## 2011-01-20 LAB — POCT HEMOGLOBIN-HEMACUE: Hemoglobin: 17.3 g/dL — ABNORMAL HIGH (ref 13.0–17.0)

## 2011-01-29 NOTE — Op Note (Addendum)
[name], [name]                  ACCOUNT NO.:  [id number]  MEDICAL RECORD NO.:  [id number]  LOCATION:                                 FACILITY:  PHYSICIAN:  Shonette Rhames A. Noemi Chapel, M.D. DATE OF BIRTH:  1965/10/06  DATE OF PROCEDURE:  01/20/2011 DATE OF DISCHARGE:                              OPERATIVE REPORT   PREOPERATIVE DIAGNOSES: 1. Left knee medial and lateral meniscal tears with chondromalacia and     synovitis. 2. Left knee posterior medial meniscal cyst.  POSTOPERATIVE DIAGNOSES: 1. Left knee medial and lateral meniscal tears with chondromalacia and     synovitis. 2. Left knee posterior medial meniscal cyst.  PROCEDURE: 1. Left knee EUA followed by arthroscopic partial medial lateral     meniscectomies. 2. Left knee chondroplasty with partial synovectomy. 3. Left knee internal meniscal cyst debridement.  SURGEON:  Audree Camel. Noemi Chapel, MD  ASSISTANT:  Matthew Saras, PA-C  ANESTHESIA:  General.  OPERATIVE TIME:  40 minutes.  COMPLICATIONS:  None.  INDICATIONS FOR PROCEDURE:  Francisco Russell is a 45 year old gentleman who has had significant left knee pain increasing in nature over the past 3-4 months with exam and MRI documenting meniscal tearing with chondromalacia and synovitis with posterior medial parameniscal cyst. He has failed conservative care and is now to undergo arthroscopy.  DESCRIPTION:  [name] was brought to the operating room on January 20, 2011 after knee block was placed in the holding room by Anesthesia. He was placed on operative table in supine position.  He received Ancef 2 grams IV preoperatively for prophylaxis.  After being placed under general anesthesia, his left knee was examined.  He had full range of motion.  Knee was stable ligamentous exam with normal patellar tracking. Left leg was prepped using sterile DuraPrep and draped using sterile technique.  Time-out procedure was called and the correct left knee identified.  Initially  through an anterolateral portal the arthroscope with a pump attached was placed into an anteromedial portal and an arthroscopic probe was placed.  On initial inspection of medial compartment, the articular cartilage showed 30-40% grade 3 chondromalacia which was debrided.  Medial meniscus showed a complex tear of the posterior medial and anterior horns of which 50% was resected back to a stable rim.  On to the posterior medial horn, I debrided internally meniscal cyst.  I also used an 18 gauge spinal needle from the posterior medial corner to perform multiple small aspirations of the cystic material as well under arthroscopic control. Intercondylar notch inspected.  Anterior and posterior cruciate ligaments were normal.  Lateral compartment inspected.  The articular cartilage was normal.  Lateral meniscus showed a partial tear 15% posterior medial root which was debrided.  Otherwise lateral meniscus was intact.  Patellofemoral joint showed 30% grade 3 chondromalacia on the femoral groove which was debrided.  Grade 1 and 2 changes on the patella.  The patella tracked normally.  Significant synovitis in the medial lateral gutters were debrided.  Otherwise this was free of pathology.  After this done, it was felt that all pathology have been satisfactorily addressed.  The instruments were removed.  Portals were closed with 3-0 nylon suture.  Sterile dressings were applied and the patient awakened and taken to recovery room in stable condition.  FOLLOWUP CARE:  Mr. Orcutt will be followed as an outpatient on [place] NX.  I will see him back in office in a week for sutures out and followup.     [name] A. Noemi Chapel, M.D.     RAW/MEDQ  D:  01/20/2011  T:  01/21/2011  Job:  [id number]  Electronically Signed by Elsie Saas M.D. on 02/10/2011 07:44:27 AM

## 2011-04-29 ENCOUNTER — Other Ambulatory Visit: Payer: Self-pay | Admitting: Internal Medicine

## 2011-04-29 ENCOUNTER — Other Ambulatory Visit: Payer: Self-pay

## 2011-04-29 MED ORDER — MERCAPTOPURINE 50 MG PO TABS: 100.0000 mg | ORAL_TABLET | Freq: Every day | ORAL | Status: AC

## 2011-05-04 NOTE — Telephone Encounter (Signed)
Refilled 04/29/11

## 2011-06-16 ENCOUNTER — Encounter: Payer: Self-pay | Admitting: Internal Medicine

## 2012-06-07 ENCOUNTER — Encounter: Payer: Self-pay | Admitting: Internal Medicine

## 2013-08-30 ENCOUNTER — Encounter: Payer: Self-pay | Admitting: Internal Medicine

## 2013-10-03 ENCOUNTER — Encounter: Payer: Self-pay | Admitting: Cardiovascular Disease

## 2013-10-03 ENCOUNTER — Ambulatory Visit (INDEPENDENT_AMBULATORY_CARE_PROVIDER_SITE_OTHER): Payer: BC Managed Care – PPO | Admitting: Cardiovascular Disease

## 2013-10-03 VITALS — BP 120/72 | HR 83 | Ht 72.0 in | Wt 304.8 lb

## 2013-10-03 DIAGNOSIS — R079 Chest pain, unspecified: Secondary | ICD-10-CM

## 2013-10-03 NOTE — Progress Notes (Signed)
HPI:  48 year old gentleman referred for cardiac evaluation. The patient has had symptoms of right shoulder and "tooth pain." Symptoms of been intermittent for about 6 months. He was evaluated by his dentist and there were no problems found. The dentist mentioned that tooth pain could be an anginal equivalent and the patient became concerned about this. The patient's tooth pain has occurred most frequently at night. There has been no association with physical exertion. Other complaints include exertional dyspnea for about 2 months. The patient went for a walk with his wife at [facility] and had tachycardia palpitations with exercise. He's had occasional chest pressure associated with periods of high stress. He has not had any exertional tooth pain, chest pain, or chest pressure. He denies edema, orthopnea, or PND.  The patient is under a lot of stress. She's taking care of an elderly family member. He has 3 sons, 2 of whom are in the [employer] and one was just deployed to [place].  Outpatient Encounter Prescriptions as of 10/03/2013  Medication Sig  . lisinopril (PRINIVIL,ZESTRIL) 20 MG tablet Take 10 mg by mouth every morning.    Codeine and Sulfonamide derivatives  Past Medical History  Diagnosis Date  . Essential hypertension   . Obstructive sleep apnea   . Ulcerative colitis   . Seasonal allergies   . Erectile dysfunction     Past Surgical History  Procedure Laterality Date  . Appendectomy      Remote  . Right shoulder surgery      2011    History   Social History  . Marital Status: Married    Spouse Name: N/A    Number of Children: N/A  . Years of Education: N/A   Occupational History  . Not on file.   Social History Main Topics  . Smoking status: Former Smoker    Quit date: 10/04/1998  . Smokeless tobacco: Not on file  . Alcohol Use: Not on file  . Drug Use: Not on file  . Sexual Activity: Not on file   Other Topics Concern  . Not on file   Social  History Narrative   Patient is married.      Quit smoking in 2001. No alcohol.   Family history: There is no history of heart failure or coronary artery disease in any first-degree relatives  ROS:  General: no fevers/chills/night sweats Eyes: no blurry vision, diplopia, or amaurosis ENT: no sore throat or hearing loss Resp: no cough, wheezing, or hemoptysis CV: no edema or palpitations GI: no abdominal pain, nausea, vomiting, diarrhea, or constipation GU: no dysuria, frequency, or hematuria Skin: no rash Neuro: no headache, numbness, tingling, or weakness of extremities Musculoskeletal: no joint pain or swelling, positive for right shoulder pain Heme: no bleeding, DVT, or easy bruising Endo: no polydipsia or polyuria  BP 120/72  Pulse 83  Ht 6' (1.829 m)  Wt 138.256 kg (304 lb 12.8 oz)  BMI 41.33 kg/m2  PHYSICAL EXAM: Pt is alert and oriented, WD, WN, in no distress. HEENT: normal Neck: JVP normal. Carotid upstrokes normal without bruits. No thyromegaly. Lungs: equal expansion, clear bilaterally CV: Apex is nonpalpable, RRR without murmur or gallop Abd: soft, NT, +BS, no bruit, obese Back: no CVA tenderness Ext: no C/C/E        DP/PT pulses intact and = Skin: warm and dry without rash Neuro: CNII-XII intact             Strength intact = bilaterally  EKG:  Normal sinus  rhythm 83 beats per minute, within normal limits.  ASSESSMENT AND PLAN: 1. Atypical shoulder/tooth pain 2. Hypertension, well controlled 3. Obesity  I think there is a relatively low pretest probability for obstructive CAD. I have recommended a non-imaging exercise treadmill study to exclude any functional limitation or exercise-induced ischemia. We discussed the importance of weight loss and regular exercise. As long as the stress test is within normal limits, I will plan on seeing him back as needed. I appreciate the opportunity to see this nice gentleman.  Sherren Mocha 10/03/2013 3:47 PM

## 2013-10-03 NOTE — Patient Instructions (Signed)
Your physician has requested that you have an exercise tolerance test with PA/NP. For further information please visit [email or web address]. Please also follow instruction sheet, as given.  Your physician recommends that you schedule a follow-up appointment as needed with Dr Burt Knack.

## 2013-10-12 ENCOUNTER — Telehealth: Payer: Self-pay | Admitting: *Deleted

## 2013-10-12 NOTE — Telephone Encounter (Signed)
John or [employer],  Please review this pt's chart.  He saw the cardiologist on 10-03-13 for atypical chest pain.  The MD ordered a stress test which the pt canceled.  Is he ok for [place] or does he need a cardiac clearance?  Thanks, [employer]

## 2013-10-12 NOTE — Telephone Encounter (Signed)
Cyril Mourning,  Yes he needs to complete his cardiac w/u prior to procedure  Thanks,  Jenny Reichmann

## 2013-10-13 ENCOUNTER — Ambulatory Visit (AMBULATORY_SURGERY_CENTER): Payer: BC Managed Care – PPO

## 2013-10-13 VITALS — Ht 73.0 in | Wt 307.0 lb

## 2013-10-13 DIAGNOSIS — Z860100 Personal history of colon polyps, unspecified: Secondary | ICD-10-CM

## 2013-10-13 MED ORDER — MOVIPREP 100 G PO SOLR: 1.0000 | Freq: Once | ORAL | Status: DC

## 2013-10-13 NOTE — Progress Notes (Signed)
No allergies to eggs or soy' No home oxygen  Uses CPAP only No diet/weight loss meds No past problems with anesthesia BMI OK Will have cardiologist send you a note for clearance  Was told his problem is not cardiac No email

## 2013-10-13 NOTE — Telephone Encounter (Signed)
The pt cancelled GXT that was recommended by Dr Burt Knack.

## 2013-10-13 NOTE — Telephone Encounter (Signed)
Pt needs cardiac clearance before he can reschedule.  Pt will also be contacting Sherren Mocha MD.

## 2013-10-15 NOTE — Telephone Encounter (Signed)
Lauren - anything we need to do with this? I guess he will notify us if he decides to reschedule.

## 2013-10-17 NOTE — Telephone Encounter (Signed)
The pt cancelled his stress test that was recommended by Dr Burt Knack.  The pt will have to contact our office to reschedule this test before we can address cardiac clearance.

## 2013-10-27 ENCOUNTER — Encounter: Payer: 59 | Admitting: Internal Medicine

## 2013-11-09 ENCOUNTER — Encounter: Payer: BC Managed Care – PPO | Admitting: Nurse Practitioner

## 2014-12-20 ENCOUNTER — Telehealth: Payer: Self-pay | Admitting: Cardiovascular Disease

## 2014-12-20 DIAGNOSIS — R0789 Other chest pain: Secondary | ICD-10-CM

## 2014-12-20 NOTE — Telephone Encounter (Signed)
I spoke with the pt and he said that he has been experiencing a "shocking sensation" in his left chest for the past 3 weeks. The episodes last for 20 seconds and radiate through his chest and down his left arm. The pt denies dizziness but is easily SOB with activity but this is not a new symptom.  The pt said if feels like he has a "TENS unit on full blast" when it occurs.  The episodes occur about 20 times per day. Over the past week the past has started to develop chest pressure and he knows that something is wrong.  The pt did not undergo stress testing as recommended by Dr Burt Knack one year ago. The pt is under a great deal of stress with family issues.  I advised the pt to proceed to ER if he develops symptoms that worsen and do not resolve. I will forward this message to Dr Burt Knack for review and further recommendations.

## 2014-12-20 NOTE — Telephone Encounter (Signed)
Agree with recs. Happy to see him tomorrow in office.  Sherren Mocha 12/20/2014 4:01 PM

## 2014-12-20 NOTE — Telephone Encounter (Signed)
New Message       Pt calling stating that he feels like someone is shocking his heart and he needs to be seen right away. Started a few weeks ago and it is getting worse and more consistent. Pt states he wouldn't describe it as chest pain but more like some kind of electric stimulation to his heart. Please call back and advise.

## 2014-12-21 NOTE — Telephone Encounter (Signed)
I spoke with the pt and made him aware of myoview that is scheduled on 12/27/14 at 12:00.  I made the pt aware of pre-test instructions and he verbalized understanding. We will arrange a follow-up office visit after the pt under goes testing as he will be a 2 day protocol.

## 2014-12-21 NOTE — Telephone Encounter (Signed)
F/U   Pt returning [place] phone call.

## 2014-12-21 NOTE — Telephone Encounter (Signed)
I discussed this pt further with Dr Burt Knack and he would like the pt to have an exercise stress myoview and then arrange OV to discuss results.  I have left the pt a message to call our office.

## 2014-12-25 ENCOUNTER — Telehealth (HOSPITAL_COMMUNITY): Payer: Self-pay

## 2014-12-25 NOTE — Telephone Encounter (Signed)
Patient given detailed instructions per Myocardial Perfusion Study Information Sheet for test on 12-27-2014 at 1200. Patient Notified to arrive 15 minutes early, and that it is imperative to arrive on time for appointment to keep from having the test rescheduled. Patient verbalized understanding. Oletta Lamas, Dionicia Cerritos A

## 2014-12-27 ENCOUNTER — Ambulatory Visit (HOSPITAL_COMMUNITY): Payer: BLUE CROSS/BLUE SHIELD | Attending: Cardiovascular Disease

## 2014-12-27 ENCOUNTER — Ambulatory Visit (HOSPITAL_COMMUNITY): Payer: Self-pay

## 2014-12-27 DIAGNOSIS — I10 Essential (primary) hypertension: Secondary | ICD-10-CM | POA: Diagnosis not present

## 2014-12-27 DIAGNOSIS — R0789 Other chest pain: Secondary | ICD-10-CM | POA: Diagnosis present

## 2014-12-27 DIAGNOSIS — R0609 Other forms of dyspnea: Secondary | ICD-10-CM | POA: Diagnosis not present

## 2014-12-27 LAB — MYOCARDIAL PERFUSION IMAGING
CHL CUP MPHR: 170 {beats}/min
CHL CUP NUCLEAR SDS: 2
CHL CUP NUCLEAR SRS: 1
CHL CUP NUCLEAR SSS: 3
CHL CUP RESTING HR STRESS: 76 {beats}/min
CSEPED: 8 min
CSEPEDS: 0 s
Estimated workload: 10 METS
LV dias vol: 126 mL
LV sys vol: 54 mL
Peak HR: 166 {beats}/min
Percent HR: 97 %
RATE: 0.3
TID: 0.83

## 2014-12-27 MED ORDER — TECHNETIUM TC 99M SESTAMIBI GENERIC - CARDIOLITE
32.5000 | Freq: Once | INTRAVENOUS | Status: AC | PRN
  Administered 2014-12-27: 33 via INTRAVENOUS

## 2014-12-27 MED ORDER — TECHNETIUM TC 99M SESTAMIBI GENERIC - CARDIOLITE
11.0000 | Freq: Once | INTRAVENOUS | Status: AC | PRN
  Administered 2014-12-27: 11 via INTRAVENOUS

## 2014-12-28 ENCOUNTER — Ambulatory Visit (HOSPITAL_COMMUNITY): Payer: Self-pay

## 2014-12-28 ENCOUNTER — Ambulatory Visit (HOSPITAL_COMMUNITY): Payer: BLUE CROSS/BLUE SHIELD

## 2014-12-28 NOTE — Telephone Encounter (Signed)
Myoview results per Dr Burt Knack:  Notes Recorded by Sherren Mocha, MD on 12/27/2014 at 6:51 PM Normal stress nuclear study. No further cardiac evaluation indicated. May report to patient. thx

## 2014-12-31 ENCOUNTER — Encounter: Payer: Self-pay | Admitting: Cardiovascular Disease

## 2014-12-31 NOTE — Telephone Encounter (Signed)
This encounter was created in error - please disregard.

## 2014-12-31 NOTE — Telephone Encounter (Signed)
New message      Calling to get stress test results

## 2015-01-09 ENCOUNTER — Encounter: Payer: Self-pay | Admitting: Cardiovascular Disease

## 2015-01-09 ENCOUNTER — Ambulatory Visit (INDEPENDENT_AMBULATORY_CARE_PROVIDER_SITE_OTHER): Payer: BLUE CROSS/BLUE SHIELD | Admitting: Cardiovascular Disease

## 2015-01-09 VITALS — BP 117/80 | HR 88 | Wt 314.0 lb

## 2015-01-09 DIAGNOSIS — R0789 Other chest pain: Secondary | ICD-10-CM

## 2015-01-09 NOTE — Progress Notes (Signed)
Cardiology Office Note   Date:  01/09/2015   ID:  Francisco Russell., DOB July 09, 1965, MRN [id number]  PCP:  Simona Huh, MD  Cardiologist:  Sherren Mocha, MD    Chief Complaint  Patient presents with  . Follow-up    Chest pain     History of Present Illness: Francisco Russell. is a 49 y.o. male who presents for evaluation of chest pain. The patient has had a previous evaluation last year for similar symptoms. At that time I have recommended a non-imaging exercise treadmill study but this was not completed. The patient called and with continued chest discomfort recently. We decided to perform a nuclear scan in this demonstrated no ischemia. He presents today to discuss whether further evaluation is required.  The patient describes electrical impulses in the left chest. Symptoms are fleeting. They occur many times per day and are self-limited. He has not had exertional pain or pressure. He says that he is "not doing much." He does have shortness of breath with activity. He denies orthopnea, PND, or heart palpitations. The patient has been under a great deal of stress related to several issues at home.   Past Medical History  Diagnosis Date  . Essential hypertension   . Obstructive sleep apnea   . Ulcerative colitis   . Seasonal allergies   . Erectile dysfunction     Past Surgical History  Procedure Laterality Date  . Appendectomy      Remote  . Right shoulder surgery      2011    Current Outpatient Prescriptions  Medication Sig Dispense Refill  . lisinopril (PRINIVIL,ZESTRIL) 20 MG tablet Take 10 mg by mouth every morning.    Marland Kitchen MOVIPREP 100 G SOLR Take 1 kit (200 g total) by mouth once. 1 kit 0  . sertraline (ZOLOFT) 50 MG tablet Take 50 mg by mouth daily.  3   No current facility-administered medications for this visit.    Allergies:   Codeine and Sulfonamide derivatives   Social History:  The patient  reports that he quit smoking about 16 years ago. He has quit using  smokeless tobacco. He reports that he does not drink alcohol or use illicit drugs.   Family History:  The patient's family history is negative for Colon cancer, Pancreatic cancer, Rectal cancer, and Stomach cancer.    ROS:  Please see the history of present illness.  All other systems are reviewed and negative.    PHYSICAL EXAM: VS:  BP 117/80 mmHg  Pulse 88  Wt 314 lb (142.429 kg) , BMI Body mass index is 41.44 kg/(m^2). GEN: Well nourished, well developed, pleasant overweight male in no acute distress HEENT: normal Neck: no JVD, no masses. No carotid bruits Cardiac: RRR without murmur or gallop                Respiratory:  clear to auscultation bilaterally, normal work of breathing GI: soft, nontender, nondistended, + BS MS: no deformity or atrophy Ext: no pretibial edema, pedal pulses 2+= bilaterally Skin: warm and dry, no rash Neuro:  Strength and sensation are intact Psych: euthymic mood, full affect  EKG:  EKG is ordered today. The ekg ordered today shows normal sinus rhythm, within normal limits.  Recent Labs: No results found for requested labs within last 365 days.   Lipid Panel  No results found for: CHOL, TRIG, HDL, CHOLHDL, VLDL, LDLCALC, LDLDIRECT    Wt Readings from Last 3 Encounters:  01/09/15 314 lb (142.429 kg)  12/27/14 307 lb (139.254 kg)  10/13/13 307 lb (139.254 kg)     Cardiac Studies Reviewed: Study Highlights     Nuclear stress EF: 57%.  There was no ST segment deviation noted during stress.  Defect 1: There is a small defect of mild severity present in the basal inferior location.  The study is normal.  This is a low risk study.  The left ventricular ejection fraction is normal (55-65%).     ASSESSMENT AND PLAN: 49 year old gentleman with atypical chest pain. I reassured him about his normal stress nuclear scan. His symptoms are highly atypical with a feeling that his chest is being "shocked" and the duration of the symptoms are only  1-2 seconds. I do not think he requires further evaluation at this point. He was encouraged about initiation of an exercise program along with further lifestyle modification and weight loss. I would be happy to see him back as needed in the future. All of his questions were answered today.   Current medicines are reviewed with the patient today.  The patient does not have concerns regarding medicines.  Labs/ tests ordered today include:  No orders of the defined types were placed in this encounter.    Disposition:   FU as needed  Signed, Sherren Mocha, MD  01/09/2015 11:26 AM    [facility] [place], [place], [place]  [address] Phone: [phone]; Fax: [phone]

## 2015-01-09 NOTE — Patient Instructions (Signed)
Medication Instructions:  Your physician recommends that you continue on your current medications as directed. Please refer to the Current Medication list given to you today.   Labwork: none  Testing/Procedures: none  Follow-Up:  Your physician recommends that you schedule a follow-up appointment as needed with Dr. Burt Knack

## 2015-01-10 ENCOUNTER — Encounter: Payer: Self-pay | Admitting: Cardiovascular Disease

## 2016-01-17 ENCOUNTER — Encounter: Payer: Self-pay | Admitting: Internal Medicine

## 2016-04-03 DIAGNOSIS — F419 Anxiety disorder, unspecified: Secondary | ICD-10-CM

## 2016-04-03 HISTORY — DX: Anxiety disorder, unspecified: F41.9

## 2016-04-24 ENCOUNTER — Ambulatory Visit (AMBULATORY_SURGERY_CENTER): Payer: Self-pay

## 2016-04-24 VITALS — Ht 72.0 in | Wt 324.6 lb

## 2016-04-24 DIAGNOSIS — Z86010 Personal history of colonic polyps: Secondary | ICD-10-CM

## 2016-04-24 DIAGNOSIS — K5100 Ulcerative (chronic) pancolitis without complications: Secondary | ICD-10-CM

## 2016-04-24 MED ORDER — NA SULFATE-K SULFATE-MG SULF 17.5-3.13-1.6 GM/177ML PO SOLN: ORAL | 0 refills | Status: DC

## 2016-04-24 NOTE — Progress Notes (Signed)
Per pt, no allergies to soy or egg products.Pt not taking any weight loss meds or using  O2 at home. 

## 2016-05-08 ENCOUNTER — Encounter: Payer: Self-pay | Admitting: Internal Medicine

## 2016-05-08 ENCOUNTER — Ambulatory Visit (AMBULATORY_SURGERY_CENTER): Payer: BLUE CROSS/BLUE SHIELD | Admitting: Internal Medicine

## 2016-05-08 VITALS — BP 113/77 | HR 74 | Temp 98.2°F | Resp 14 | Ht 72.0 in | Wt 324.0 lb

## 2016-05-08 DIAGNOSIS — D122 Benign neoplasm of ascending colon: Secondary | ICD-10-CM

## 2016-05-08 DIAGNOSIS — Z86010 Personal history of colonic polyps: Secondary | ICD-10-CM | POA: Diagnosis present

## 2016-05-08 DIAGNOSIS — K5100 Ulcerative (chronic) pancolitis without complications: Secondary | ICD-10-CM

## 2016-05-08 DIAGNOSIS — K529 Noninfective gastroenteritis and colitis, unspecified: Secondary | ICD-10-CM | POA: Diagnosis not present

## 2016-05-08 MED ORDER — SODIUM CHLORIDE 0.9 % IV SOLN: 500.0000 mL | INTRAVENOUS | Status: DC

## 2016-05-08 NOTE — Patient Instructions (Addendum)
YOU HAD AN ENDOSCOPIC PROCEDURE TODAY AT [facility]:   Refer to the procedure report that was given to you for any specific questions about what was found during the examination.  If the procedure report does not answer your questions, please call your gastroenterologist to clarify.  If you requested that your care partner not be given the details of your procedure findings, then the procedure report has been included in a sealed envelope for you to review at your convenience later.  YOU SHOULD EXPECT: Some feelings of bloating in the abdomen. Passage of more gas than usual.  Walking can help get rid of the air that was put into your GI tract during the procedure and reduce the bloating. If you had a lower endoscopy (such as a colonoscopy or flexible sigmoidoscopy) you may notice spotting of blood in your stool or on the toilet paper. If you underwent a bowel prep for your procedure, you may not have a normal bowel movement for a few days.  Please Note:  You might notice some irritation and congestion in your nose or some drainage.  This is from the oxygen used during your procedure.  There is no need for concern and it should clear up in a day or so.  SYMPTOMS TO REPORT IMMEDIATELY:   Following lower endoscopy (colonoscopy or flexible sigmoidoscopy):  Excessive amounts of blood in the stool  Significant tenderness or worsening of abdominal pains  Swelling of the abdomen that is new, acute  Fever of 100F or higher    For urgent or emergent issues, a gastroenterologist can be reached at any hour by calling [phone].   DIET:  We do recommend a small meal at first, but then you may proceed to your regular diet.  Drink plenty of fluids but you should avoid alcoholic beverages for 24 hours.  ACTIVITY:  You should plan to take it easy for the rest of today and you should NOT DRIVE or use heavy machinery until tomorrow (because of the sedation medicines used during the test).     FOLLOW UP: Our staff will call the number listed on your records the next business day following your procedure to check on you and address any questions or concerns that you may have regarding the information given to you following your procedure. If we do not reach you, we will leave a message.  However, if you are feeling well and you are not experiencing any problems, there is no need to return our call.  We will assume that you have returned to your regular daily activities without incident.  If any biopsies were taken you will be contacted by phone or by letter within the next 1-3 weeks.  Please call us at [phone] if you have not heard about the biopsies in 3 weeks.    SIGNATURES/CONFIDENTIALITY: You and/or your care partner have signed paperwork which will be entered into your electronic medical record.  These signatures attest to the fact that that the information above on your After Visit Summary has been reviewed and is understood.  Full responsibility of the confidentiality of this discharge information lies with you and/or your care-partner.   Resume medications. Information given on polyps.

## 2016-05-08 NOTE — Progress Notes (Signed)
A/ox3 pleased with MAC, report to [facility]

## 2016-05-08 NOTE — Op Note (Signed)
[facility] Patient Name: Francisco Russell Procedure Date: 05/08/2016 7:56 AM MRN: [id number] Endoscopist: Docia Chuck. [name] , MD Age: 51 Referring MD:  Date of Birth: 09-10-1965 Gender: Male Account #: [id number] Procedure:                Colonoscopy, with biopsies and cold snare                            polypectomy X2 Indications:              High risk colon cancer surveillance: Personal                            history of non-advanced adenoma, High risk colon                            cancer surveillance: Ulcerative pancolitis of 8 (or                            more) years duration. Initially diagnosed with pan                            ulcerative colitis 2003.Had follow-up colonoscopy                            2006 with active colitis. Last colonoscopy 2011                            with no evidence for active colitis macroscopically                            or microscopically. Sporadic adenoma removed. Told                            to follow-up in 2 years but has not been seen                            since. Referred back by his PCP at this time.                            Denies active colitis symptoms Medicines:                Monitored Anesthesia Care Procedure:                Pre-Anesthesia Assessment:                           - Prior to the procedure, a History and Physical                            was performed, and patient medications and                            allergies were reviewed. The patient's tolerance of  previous anesthesia was also reviewed. The risks                            and benefits of the procedure and the sedation                            options and risks were discussed with the patient.                            All questions were answered, and informed consent                            was obtained. Prior Anticoagulants: The patient has                            taken no previous anticoagulant or  antiplatelet                            agents. ASA Grade Assessment: II - A patient with                            mild systemic disease. After reviewing the risks                            and benefits, the patient was deemed in                            satisfactory condition to undergo the procedure.                           After obtaining informed consent, the colonoscope                            was passed under direct vision. Throughout the                            procedure, the patient's blood pressure, pulse, and                            oxygen saturations were monitored continuously. The                            Model CF-HQ190L [id number]) scope was introduced                            through the anus and advanced to the the cecum,                            identified by appendiceal orifice and ileocecal                            valve. The ileocecal valve, appendiceal orifice,  and rectum were photographed. The quality of the                            bowel preparation was excellent. The colonoscopy                            was performed without difficulty. The patient                            tolerated the procedure well. The bowel preparation                            used was SUPREP. Scope In: 8:22:12 AM Scope Out: 8:42:03 AM Scope Withdrawal Time: 0 hours 17 minutes 55 seconds  Total Procedure Duration: 0 hours 19 minutes 51 seconds  Findings:                 Two polyps were found in the ascending colon. The                            polyps were 2 to 5 mm in size. These polyps were                            removed with a cold snare. Resection and retrieval                            were complete.                           The exam was otherwise without abnormality on                            direct and retroflexion views. No evidence for                            active colitis.                           Four  biopsies were taken every 10 cm with a cold                            forceps from the entire colon for ulcerative                            colitis surveillance. These biopsy specimens from                            the entire colon were sent to Pathology. Complications:            No immediate complications. Estimated blood loss:                            None. Estimated Blood Loss:     Estimated blood loss: none. Impression:               -  Two 2 to 5 mm polyps in the ascending colon,                            removed with a cold snare. Resected and retrieved.                           - The examination was otherwise normal on direct                            and retroflexion views. No evidence for active                            colitis.                           - Biopsies for surveillance were taken from the                            entire colon. Recommendation:           - Repeat colonoscopy date to be determined after                            pending pathology results are reviewed for                            surveillance.                           - Patient has a contact number available for                            emergencies. The signs and symptoms of potential                            delayed complications were discussed with the                            patient. Return to normal activities tomorrow.                            Written discharge instructions were provided to the                            patient.                           - Resume previous diet.                           - Continue present medications.                           - Await pathology results. Docia Chuck. [name], MD 05/08/2016 9:00:43 AM This report has been signed electronically.

## 2016-05-08 NOTE — Progress Notes (Signed)
Called to room to assist during endoscopic procedure.  Patient ID and intended procedure confirmed with present staff. Received instructions for my participation in the procedure from the performing physician.  

## 2016-05-11 ENCOUNTER — Telehealth: Payer: Self-pay

## 2016-05-11 ENCOUNTER — Telehealth: Payer: Self-pay | Admitting: *Deleted

## 2016-05-11 NOTE — Telephone Encounter (Signed)
Message left

## 2016-05-11 NOTE — Telephone Encounter (Signed)
  Follow up Call-  Call back number 05/08/2016  Post procedure Call Back phone  # [phone]  Permission to leave phone message Yes  Some recent data might be hidden    Patient was called for follow up after his procedure on 05/08/2016. No answer at the number given for follow up phone call. A message was left on the answering machine.

## 2016-05-13 ENCOUNTER — Encounter: Payer: Self-pay | Admitting: Internal Medicine

## 2017-03-04 DIAGNOSIS — G4730 Sleep apnea, unspecified: Secondary | ICD-10-CM | POA: Diagnosis not present

## 2017-03-04 DIAGNOSIS — I10 Essential (primary) hypertension: Secondary | ICD-10-CM | POA: Diagnosis not present

## 2017-03-04 DIAGNOSIS — K5190 Ulcerative colitis, unspecified, without complications: Secondary | ICD-10-CM | POA: Diagnosis not present

## 2017-03-04 DIAGNOSIS — Z23 Encounter for immunization: Secondary | ICD-10-CM | POA: Diagnosis not present

## 2017-03-04 DIAGNOSIS — E781 Pure hyperglyceridemia: Secondary | ICD-10-CM | POA: Diagnosis not present

## 2018-03-23 DIAGNOSIS — Z23 Encounter for immunization: Secondary | ICD-10-CM | POA: Diagnosis not present

## 2018-03-23 DIAGNOSIS — G4730 Sleep apnea, unspecified: Secondary | ICD-10-CM | POA: Diagnosis not present

## 2018-03-23 DIAGNOSIS — K5190 Ulcerative colitis, unspecified, without complications: Secondary | ICD-10-CM | POA: Diagnosis not present

## 2018-03-23 DIAGNOSIS — I10 Essential (primary) hypertension: Secondary | ICD-10-CM | POA: Diagnosis not present

## 2018-03-23 DIAGNOSIS — R454 Irritability and anger: Secondary | ICD-10-CM | POA: Diagnosis not present

## 2019-01-16 DIAGNOSIS — M549 Dorsalgia, unspecified: Secondary | ICD-10-CM | POA: Diagnosis not present

## 2019-01-16 DIAGNOSIS — Z23 Encounter for immunization: Secondary | ICD-10-CM | POA: Diagnosis not present

## 2019-01-17 ENCOUNTER — Telehealth: Payer: Self-pay | Admitting: Internal Medicine

## 2019-01-17 NOTE — Telephone Encounter (Signed)
Pt states he is having a colitis flare and he is going out of town in a few weeks. Pt states he has had blood in his stool, his stools are softer than normal and he states he is having "belly pain." Pt states Dr. [name] has given him mercaptapurine in the past. Pt has not been seen in 2 years. Please advise.

## 2019-01-17 NOTE — Telephone Encounter (Signed)
Cannot prescribe anything until he is seen.  I have a few office openings next week.  If that is not soon enough for him, have him see 1 of the advanced practitioners

## 2019-01-17 NOTE — Telephone Encounter (Signed)
Left message for pt to call back.  Pt aware and scheduled to see Nicoletta Ba PA Thursday at 8:30am.

## 2019-01-19 ENCOUNTER — Ambulatory Visit (INDEPENDENT_AMBULATORY_CARE_PROVIDER_SITE_OTHER): Payer: BC Managed Care – PPO | Admitting: Physician Assistant

## 2019-01-19 ENCOUNTER — Other Ambulatory Visit: Payer: Self-pay | Admitting: Family Medicine

## 2019-01-19 ENCOUNTER — Other Ambulatory Visit: Payer: Self-pay

## 2019-01-19 ENCOUNTER — Telehealth: Payer: Self-pay | Admitting: Physician Assistant

## 2019-01-19 ENCOUNTER — Other Ambulatory Visit (INDEPENDENT_AMBULATORY_CARE_PROVIDER_SITE_OTHER): Payer: BC Managed Care – PPO

## 2019-01-19 ENCOUNTER — Encounter: Payer: Self-pay | Admitting: Physician Assistant

## 2019-01-19 VITALS — BP 120/80 | HR 81 | Temp 98.4°F | Ht 72.0 in | Wt 328.0 lb

## 2019-01-19 DIAGNOSIS — Z86010 Personal history of colonic polyps: Secondary | ICD-10-CM

## 2019-01-19 DIAGNOSIS — K51818 Other ulcerative colitis with other complication: Secondary | ICD-10-CM

## 2019-01-19 DIAGNOSIS — R109 Unspecified abdominal pain: Secondary | ICD-10-CM

## 2019-01-19 LAB — COMPREHENSIVE METABOLIC PANEL
ALT: 29 U/L (ref 0–53)
AST: 19 U/L (ref 0–37)
Albumin: 4.4 g/dL (ref 3.5–5.2)
Alkaline Phosphatase: 86 U/L (ref 39–117)
BUN: 11 mg/dL (ref 6–23)
CO2: 26 mEq/L (ref 19–32)
Calcium: 9.5 mg/dL (ref 8.4–10.5)
Chloride: 101 mEq/L (ref 96–112)
Creatinine, Ser: 0.9 mg/dL (ref 0.40–1.50)
GFR: 87.76 mL/min (ref >60.00)
Glucose, Bld: 97 mg/dL (ref 70–99)
Potassium: 4.5 mEq/L (ref 3.5–5.1)
Sodium: 135 mEq/L (ref 135–145)
Total Bilirubin: 0.8 mg/dL (ref 0.2–1.2)
Total Protein: 7.6 g/dL (ref 6.0–8.3)

## 2019-01-19 LAB — CBC WITH DIFFERENTIAL/PLATELET
Basophils Absolute: 0 10*3/uL (ref 0.0–0.1)
Basophils Relative: 0.6 % (ref 0.0–3.0)
Eosinophils Absolute: 0.2 10*3/uL (ref 0.0–0.7)
Eosinophils Relative: 2.4 % (ref 0.0–5.0)
HCT: 46.6 % (ref 39.0–52.0)
Hemoglobin: 16.4 g/dL (ref 13.0–17.0)
Lymphocytes Relative: 24.4 % (ref 12.0–46.0)
Lymphs Abs: 1.7 10*3/uL (ref 0.7–4.0)
MCHC: 35.1 g/dL (ref 30.0–36.0)
MCV: 90.9 fl (ref 78.0–100.0)
Monocytes Absolute: 0.7 10*3/uL (ref 0.1–1.0)
Monocytes Relative: 9.5 % (ref 3.0–12.0)
Neutro Abs: 4.5 10*3/uL (ref 1.4–7.7)
Neutrophils Relative %: 63.1 % (ref 43.0–77.0)
Platelets: 191 10*3/uL (ref 150.0–400.0)
RBC: 5.13 Mil/uL (ref 4.22–5.81)
RDW: 13.1 % (ref 11.5–15.5)
WBC: 7.2 10*3/uL (ref 4.0–10.5)

## 2019-01-19 LAB — C-REACTIVE PROTEIN: CRP: <1 mg/dL (ref 0.5–20.0)

## 2019-01-19 LAB — SEDIMENTATION RATE: Sed Rate: 16 mm/hr (ref 0–20)

## 2019-01-19 MED ORDER — BUDESONIDE ER 9 MG PO TB24: 9.0000 mg | ORAL_TABLET | Freq: Every morning | ORAL | 4 refills | Status: DC

## 2019-01-19 MED ORDER — DICYCLOMINE HCL 10 MG PO CAPS: 10.0000 mg | ORAL_CAPSULE | Freq: Three times a day (TID) | ORAL | 2 refills | Status: DC

## 2019-01-19 MED ORDER — MESALAMINE 1.2 G PO TBEC: 2.4000 g | DELAYED_RELEASE_TABLET | Freq: Two times a day (BID) | ORAL | 8 refills | Status: DC

## 2019-01-19 NOTE — Telephone Encounter (Signed)
Any way we could call his insurance or have him call his insurance to find out what they cover for Ulcerative colitis ?Marland Kitchen. any mesalamine? , budesonide ?  Mercaptopurine ?   Would like him to get the bentyl if not too expensive  We have samples of Apriso - I pulled enough for 2 weeks- on my desk  - he can come by and try - will be 0.375 g, 4 tablets daily   He did not want to start Prednisone , but probably needs to  In interim - can start low dose 20 mg daily x 2 weeks then 15 mg daily x 2 weeks then 10 mg daily x 2 weeks

## 2019-01-19 NOTE — Telephone Encounter (Signed)
[facility] pharmacist reports Doristine Johns will cost $1400 Uceris will cost $1300 No alternatives offered by insurance. This is the covered price.

## 2019-01-19 NOTE — Progress Notes (Signed)
Subjective:    Patient ID: Francisco Russell., male    DOB: [birth year], 53 y.o.   MRN: [id number]  HPI Francisco Russell is a pleasant 53 year old white male, known to Dr. [name] with history of ulcerative pancolitis initially diagnosed in 2003.Marland Kitchen  Also with hypertension and obesity. Patient last underwent colonoscopy in January 2018 for surveillance.  He also has history of adenomatous colon polyps. He had 2 small polyps removed each 3 to 5 mm in size.  Path on the polyps consistent with sessile serrated adenomas.  There was no evidence of active colitis, biopsies showed chronic inactive colitis from the right colon to the rectum.  He is indicated for 3-year interval follow-up. Patient says he has not had any colitis symptoms in several years and has not been on any medication in many years.  The last medication he recalls taking was Purinethol which he says worked very well.  He has been treated with steroids remotely as well.  Patient says his current symptoms started about a month ago.  He is now having constant abdominal pain and discomfort over the past 3 weeks and has had some episodes of rectal bleeding which he says may last for 2 to 3 days associated with bowel movements.  He is not having diarrhea.  No fever or chills, no nausea or vomiting and appetite has been fine.  He says he knows his colitis is flaring and he wants to catch it before it gets too bad.  He is going on a family vacation to the [place] in a couple of weeks and hopes to be able to feel okay for that.  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 No recent antibiotics, no known infectious exposures.  No regular aspirin or NSAIDs.  Review of Systems Pertinent positive and negative review of systems were noted in the above HPI section.  All other review of systems was otherwise negative.  Outpatient Encounter  Medications as of 01/19/2019  Medication Sig  . Budesonide ER (UCERIS) 9 MG TB24 Take 9 mg by mouth every morning.  . dicyclomine (BENTYL) 10 MG capsule Take 1 capsule (10 mg total) by mouth 3 (three) times daily before meals. As needed for abdominal pain  . lisinopril (PRINIVIL,ZESTRIL) 20 MG tablet Take 10 mg by mouth every morning.  . mesalamine (LIALDA) 1.2 g EC tablet Take 2 tablets (2.4 g total) by mouth 2 (two) times daily.  . [DISCONTINUED] sertraline (ZOLOFT) 50 MG tablet Take 50 mg by mouth daily.   Facility-Administered Encounter Medications as of 01/19/2019  Medication  . 0.9 %  sodium chloride infusion   Allergies  Allergen Reactions  . Codeine     Rash, hives  . Sulfonamide Derivatives     Rash, hives   Patient Active Problem List   Diagnosis Date Noted  . PERSONAL HX COLONIC POLYPS 01/21/2010  . COLITIS, ULCERATIVE, UNIVERSAL 05/26/2007  . SKIN RASH 05/26/2007   Social History   Socioeconomic History  . Marital status: Married    Spouse name: Not on file  . Number of children: Not on file  . Years of education: Not on file  . Highest education level: Not on file  Occupational History  . Not on file  Social Needs  . Financial resource strain: Not on file  . Food insecurity    Worry:  Not on file    Inability: Not on file  . Transportation needs    Medical: Not on file    Non-medical: Not on file  Tobacco Use  . Smoking status: Former Smoker    Quit date: 10/04/1998    Years since quitting: 20.3  . Smokeless tobacco: Former Systems developer    Types: Chew  Substance and Sexual Activity  . Alcohol use: No  . Drug use: No  . Sexual activity: Not on file  Lifestyle  . Physical activity    Days per week: Not on file    Minutes per session: Not on file  . Stress: Not on file  Relationships  . Social Herbalist on phone: Not on file    Gets together: Not on file    Attends religious service: Not on file    Active member of club or organization: Not on  file    Attends meetings of clubs or organizations: Not on file    Relationship status: Not on file  . Intimate partner violence    Fear of current or ex partner: Not on file    Emotionally abused: Not on file    Physically abused: Not on file    Forced sexual activity: Not on file  Other Topics Concern  . Not on file  Social History Narrative   Patient is married.      Quit smoking in 2001. No alcohol.    [name] family history includes Breast cancer in his maternal grandmother; Colitis in his father; Colon cancer in his cousin; Diverticulitis in his mother; Heart disease in his maternal grandmother; Stroke in his maternal uncle.      Objective:    Vitals:   01/19/19 0838  BP: 120/80  Pulse: 81  Temp: 98.4 F (36.9 C)    Physical Exam Well-developed well-nourished older white male in no acute distress.  Height, Weight, 328 BMI 44.4  HEENT; nontraumatic normocephalic, EOMI, PER R LA, sclera anicteric. Oropharynx; not examined/mask/COVID Neck; supple, no JVD Cardiovascular; regular rate and rhythm with S1-S2, no murmur rub or gallop Pulmonary; Clear bilaterally Abdomen; soft, obese, nontender to palpation,, nondistended, no palpable mass or hepatosplenomegaly, bowel sounds are active Rectal; not done today Skin; benign exam, no jaundice rash or appreciable lesions Extremities; no clubbing cyanosis or edema skin warm and dry Neuro/Psych; alert and oriented x4, grossly nonfocal mood and affect appropriate       Assessment & Plan:   #36 53 year old white male with 3 to 4-week history of fairly constant abdominal pain/cramping, and intermittent rectal bleeding in setting of history of pan ulcerative colitis. Patient initially diagnosed in 2003, he has not had any active symptoms in several years and has not been on any maintenance medication. Last colonoscopy 2018 with normal-appearing mucosa and biopsy showing chronic inactive colitis from the right colon to the  rectum.  Suspect mild to moderate exacerbation of UC.  #2 history of sessile serrated adenomas, and adenomatous colon polyps.  Patient will be due for follow-up colonoscopy January 2021 #3 morbid obesity 4.  Hypertension  Plan; check CBC with differential, sed rate, CRP, c-Met and QuantiFERON gold. Start Lialda 4.8 g daily Start Uceris 9 mg p.o. every morning Start dicyclomine 10 mg p.o. 3 times daily as needed for pain and cramping. Patient states he already had his influenza vaccine a couple of weeks ago. We will schedule follow-up office visit with Dr. [name] in 3 to 4 weeks.  Patient is asked  to call in the interim he has any worsening of symptoms or failure to improve with the above regimen. We discussed initiating steroids but he would prefer to avoid prednisone if possible.  Amy Genia Harold PA-C 01/19/2019   Cc: Gaynelle Arabian, MD

## 2019-01-19 NOTE — Patient Instructions (Signed)
Go to the basement today for labs  We will send Lialda, Uceris and Bentyl today to your pharmacy  Call back in 10-14 days if not feeling better and ask to speak to the nurse  If you are age 54 or older, your body mass index should be between 23-30. Your Body mass index is 44.48 kg/m. If this is out of the aforementioned range listed, please consider follow up with your Primary Care Provider.  If you are age 44 or younger, your body mass index should be between 19-25. Your Body mass index is 44.48 kg/m. If this is out of the aformentioned range listed, please consider follow up with your Primary Care Provider.

## 2019-01-20 ENCOUNTER — Other Ambulatory Visit: Payer: Self-pay

## 2019-01-20 MED ORDER — PREDNISONE 5 MG PO TABS: ORAL_TABLET | ORAL | 0 refills | Status: DC

## 2019-01-20 MED ORDER — MESALAMINE ER 0.375 G PO CP24: 1500.0000 mg | ORAL_CAPSULE | Freq: Every day | ORAL | 3 refills | Status: DC

## 2019-01-20 NOTE — Telephone Encounter (Signed)
  Spoke with the patient. He does not know what is covered by his policy. Agrees to go on prednisone. He states he only wants to take it for 2 weeks. Explained the tapering plan. He did not decline or agree. Says he understands.  Tried to speak with the pharmacy benefits administrator "[employer]" They cannot talk to me without a provider identification number.  The patient states he does not know how his pharmacy benefits work, if he has a deductible or a tier plan. No PA forms available on "Cover My Meds." Rx sent for the Apriso and prednisone. Patient is aware. I have asked the pharmacist to let us know

## 2019-01-20 NOTE — Progress Notes (Signed)
Assessment and plan noted

## 2019-01-20 NOTE — Progress Notes (Signed)
Rx sent for the Apriso. Spoke with the patient. He does not know what is covered by his policy. Agrees to go on prednisone. He states he only wants to take it for 2 weeks. Explained the tapering plan. He did not decline or agree. Says he understands.  Tried to speak with the pharmacy benefits administrator "[employer]" They cannot talk to me without a provider identification number.  The patient states he does not know how his pharmacy benefits work, if he has a deductible or a tier plan. No PA forms available on "Cover My Meds."

## 2019-01-22 LAB — QUANTIFERON-TB GOLD PLUS
Mitogen-NIL: 6.83 IU/mL
NIL: 0.02 IU/mL
QuantiFERON-TB Gold Plus: NEGATIVE
TB1-NIL: 0.01 IU/mL
TB2-NIL: 0.01 IU/mL

## 2019-01-27 ENCOUNTER — Other Ambulatory Visit: Payer: BC Managed Care – PPO

## 2019-02-23 ENCOUNTER — Other Ambulatory Visit: Payer: Self-pay

## 2019-02-23 ENCOUNTER — Encounter: Payer: Self-pay | Admitting: Internal Medicine

## 2019-02-23 ENCOUNTER — Ambulatory Visit (INDEPENDENT_AMBULATORY_CARE_PROVIDER_SITE_OTHER): Payer: BC Managed Care – PPO | Admitting: Internal Medicine

## 2019-02-23 VITALS — BP 122/70 | HR 92 | Temp 98.5°F | Ht 72.0 in | Wt 330.0 lb

## 2019-02-23 DIAGNOSIS — K51919 Ulcerative colitis, unspecified with unspecified complications: Secondary | ICD-10-CM

## 2019-02-23 DIAGNOSIS — Z1159 Encounter for screening for other viral diseases: Secondary | ICD-10-CM

## 2019-02-23 MED ORDER — MERCAPTOPURINE 50 MG PO TABS: 100.0000 mg | ORAL_TABLET | Freq: Every day | ORAL | 6 refills | Status: AC

## 2019-02-23 MED ORDER — PREDNISONE 20 MG PO TABS: 20.0000 mg | ORAL_TABLET | Freq: Every day | ORAL | 3 refills | Status: AC

## 2019-02-23 NOTE — Progress Notes (Signed)
HISTORY OF PRESENT ILLNESS:  Francisco Russell. is a 53 y.o. male with a history of longstanding universal ulcerative colitis, morbid obesity, anxiety, and medical noncompliance.  I last saw the patient May 08, 2016 for surveillance colonoscopy.  He was found to have 2 diminutive polyps which were removed and found to be sessile serrated polyps without cytologic dysplasia.  The remainder of the colon was grossly normal.  However, biopsies showed chronic inactive colitis in the right colon to rectum.  Routine follow-up in 3 years recommended.  Patient's preference has been to be off of medical therapy and undergo active surveillance.  He contacted the office last month with complaints of abdominal cramping with intermittent rectal bleeding and increased bowel frequency.  This is been going on for several weeks.  He felt this represented a flare of his colitis and was seen by the GI physician assistant January 19, 2019.  I have reviewed that encounter.  Blood work from that day including comprehensive metabolic panel, C-reactive protein, CBC with differential, sedimentation rate, and QuantiFERON gold testing were all normal.  He was prescribed Lialda and Uceris (he did not like side effects of prednisone which she had taken multiple times in the past) but did not fill these prescriptions due to cost.  He was given 2 weeks of mesalamine therapy samples and started on prednisone 20 mg daily.  He was given dicyclomine for cramping.  He follows up at this time.  He tells me that the bleeding has stopped.  His bowel movements have improved though still soft.  His big complaint is bloating and gas.  He is anxious and stressed.  He reminds me that he felt like he did well in the past when he was on maintenance Purinethol therapy (100 mg daily).  Currently he is on 10 mg of prednisone daily.  He feels like since he was down from 20 mg daily his symptoms have slightly worsened.  REVIEW OF SYSTEMS:  All non-GI ROS  negative unless otherwise stated in the HPI except for anxiety  Past Medical History:  Diagnosis Date  . Allergy   . Anxiety 04/2016   due to passing of father  . Erectile dysfunction   . Essential hypertension   . Obstructive sleep apnea   . Seasonal allergies   . Sleep apnea    on c-pap  . Ulcerative colitis ([place])    has had for years    Past Surgical History:  Procedure Laterality Date  . APPENDECTOMY     Remote  . COLONOSCOPY    . KNEE SURGERY     arthroscopic/ twice on left knee  . Right shoulder surgery     2011  . TONSILLECTOMY AND ADENOIDECTOMY     adenoids removed as a child    Social History [place].  reports that he quit smoking about 20 years ago. He has quit using smokeless tobacco.  His smokeless tobacco use included chew. He reports that he does not drink alcohol or use drugs.  family history includes Breast cancer in his maternal grandmother; Colitis in his father; Colon cancer in his cousin; Diverticulitis in his mother; Heart disease in his maternal grandmother; Other in his father; Stroke in his maternal uncle.  Allergies  Allergen Reactions  . Codeine     Rash, hives  . Sulfonamide Derivatives     Rash, hives       PHYSICAL EXAMINATION: Vital signs: BP 122/70   Pulse 92   Temp 98.5 F (  36.9 C)   Ht 6' (1.829 m)   Wt (!) 330 lb (149.7 kg)   BMI 44.76 kg/m   Constitutional: Obese otherwise generally well-appearing, no acute distress Psychiatric: alert and oriented x3, cooperative Eyes: extraocular movements intact, anicteric, conjunctiva pink Mouth: oral pharynx moist, no lesions Neck: supple no lymphadenopathy Cardiovascular: heart regular rate and rhythm, no murmur Lungs: clear to auscultation bilaterally Abdomen: soft, obese, nontender, nondistended, no obvious ascites, no peritoneal signs, normal bowel sounds, no organomegaly.  Diastases abdominis recti Rectal: Deferred until colonoscopy Extremities: no clubbing, cyanosis, or  lower extremity edema bilaterally Skin: no lesions on visible extremities Neuro: No focal deficits.  Cranial nerves intact  ASSESSMENT:  1.  Longstanding ulcerative colitis.  Has been off medical therapy.  Last colonoscopy about 3 years ago with chronic inactive disease and diminutive sessile serrated polyps.  Seemingly in the midst of a flare which I would deem is mild to moderate 2.  Morbid obesity 3.  Complaints of bloating and gas.  Chronic 4.  History of medical noncompliance at times  PLAN:  1.  Resume prednisone at 20 mg daily.  Prescribed.  Side effects and risks reviewed.  He is aware and acknowledges. 2.  Prescribe 6-mercaptopurine 100 mg daily.  Side effects and risks reviewed.  He is aware and acknowledges. 3.  Schedule colonoscopy to assess the activity and severity of his ulcerative colitis.  This may help guide management.  The patient is high risk given his morbid obesity and body habitusThe nature of the procedure, as well as the risks, benefits, and alternatives were carefully and thoroughly reviewed with the patient. Ample time for discussion and questions allowed. The patient understood, was satisfied, and agreed to proceed. 4.  Weight loss 5.  Follow-up and additional recommendations to be determined after his colonoscopy has been completed

## 2019-02-23 NOTE — Patient Instructions (Addendum)
We have sent the following medications to your pharmacy for you to pick up at your convenience:  Prednisone, Mercaptopurine  You have been scheduled for a colonoscopy. Please follow written instructions given to you at your visit today.  Please pick up your prep supplies at the pharmacy within the next 1-3 days. If you use inhalers (even only as needed), please bring them with you on the day of your procedure.  Due to recent COVID-19 restrictions implemented by [employer] and state authorities and in an effort to keep both patients and staff as safe as possible, [place] requires COVID-19 testing prior to any scheduled endoscopic procedure. The testing center is located at [address] [place], [place] [address] in the [facility] [employer]  suite.  Your appointment has been scheduled for 10:30am on 04/05/2019.   Please bring your insurance cards to this appointment. You will require your COVID screen 2 business days prior to your endoscopic procedure.  You are not required to quarantine after your screening.  You will only receive a phone call with the results if it is POSITIVE.  If you do not receive a call the day before your procedure you should begin your prep, if ordered, and you should report to the endo center for your procedure at your designated appointment arrival time ( one hour prior to the procedure time). There is no cost to you for the screening on the day of the swab.  [facility] will file with your insurance company for the testing.    You may receive an automated phone call prior to your procedure or have a message in your MyChart that you have an appointment for a BP/15 at the [facility], please disregard this message.  Your testing will be at the [address] [address] [place] location.   If you are leaving [facility] travel [address] on [address],  turn left onto [address], turn night onto [address] at the 1st stop light turn right, pass the [employer] on your right and proceed to [address] (white building).

## 2019-02-24 ENCOUNTER — Other Ambulatory Visit: Payer: Self-pay | Admitting: Physician Assistant

## 2019-03-23 DIAGNOSIS — K5190 Ulcerative colitis, unspecified, without complications: Secondary | ICD-10-CM | POA: Diagnosis not present

## 2019-03-23 DIAGNOSIS — I10 Essential (primary) hypertension: Secondary | ICD-10-CM | POA: Diagnosis not present

## 2019-03-23 DIAGNOSIS — G4730 Sleep apnea, unspecified: Secondary | ICD-10-CM | POA: Diagnosis not present

## 2019-03-23 DIAGNOSIS — E781 Pure hyperglyceridemia: Secondary | ICD-10-CM | POA: Diagnosis not present

## 2019-03-24 DIAGNOSIS — D229 Melanocytic nevi, unspecified: Secondary | ICD-10-CM | POA: Diagnosis not present

## 2019-03-24 DIAGNOSIS — L814 Other melanin hyperpigmentation: Secondary | ICD-10-CM | POA: Diagnosis not present

## 2019-03-24 DIAGNOSIS — D1801 Hemangioma of skin and subcutaneous tissue: Secondary | ICD-10-CM | POA: Diagnosis not present

## 2019-04-05 ENCOUNTER — Ambulatory Visit (INDEPENDENT_AMBULATORY_CARE_PROVIDER_SITE_OTHER): Payer: BC Managed Care – PPO

## 2019-04-05 ENCOUNTER — Encounter: Payer: Self-pay | Admitting: Internal Medicine

## 2019-04-05 ENCOUNTER — Other Ambulatory Visit: Payer: Self-pay | Admitting: Internal Medicine

## 2019-04-05 DIAGNOSIS — Z1159 Encounter for screening for other viral diseases: Secondary | ICD-10-CM | POA: Diagnosis not present

## 2019-04-06 LAB — SARS CORONAVIRUS 2 (TAT 6-24 HRS): SARS Coronavirus 2: NEGATIVE

## 2019-04-07 ENCOUNTER — Other Ambulatory Visit: Payer: Self-pay

## 2019-04-07 ENCOUNTER — Encounter: Payer: Self-pay | Admitting: Internal Medicine

## 2019-04-07 ENCOUNTER — Ambulatory Visit (AMBULATORY_SURGERY_CENTER): Payer: BC Managed Care – PPO | Admitting: Internal Medicine

## 2019-04-07 VITALS — BP 86/41 | HR 82 | Temp 98.0°F | Resp 25 | Ht 72.0 in | Wt 330.0 lb

## 2019-04-07 DIAGNOSIS — K5140 Inflammatory polyps of colon without complications: Secondary | ICD-10-CM

## 2019-04-07 DIAGNOSIS — K51919 Ulcerative colitis, unspecified with unspecified complications: Secondary | ICD-10-CM

## 2019-04-07 DIAGNOSIS — D122 Benign neoplasm of ascending colon: Secondary | ICD-10-CM

## 2019-04-07 DIAGNOSIS — K5730 Diverticulosis of large intestine without perforation or abscess without bleeding: Secondary | ICD-10-CM

## 2019-04-07 DIAGNOSIS — Z1211 Encounter for screening for malignant neoplasm of colon: Secondary | ICD-10-CM | POA: Diagnosis not present

## 2019-04-07 MED ORDER — RIFAXIMIN 550 MG PO TABS: 550.0000 mg | ORAL_TABLET | Freq: Three times a day (TID) | ORAL | 1 refills | Status: AC

## 2019-04-07 MED ORDER — SODIUM CHLORIDE 0.9 % IV SOLN: 500.0000 mL | INTRAVENOUS | Status: AC

## 2019-04-07 NOTE — Progress Notes (Signed)
Temp JB  vs [place]

## 2019-04-07 NOTE — Patient Instructions (Signed)
Thank you for allowing [place] to care for you today!  Await pathology results , approximately 2 weeks.  We will notify you of the results.  Decrease prednisone to 10 mg by mouth every other day for 1 week then stop.  Continue 6-mercaptopurine daily without change.  Start Xifaxan 550 mg 3 times daily Fill 21 with 1 refill ( this is for chronic bloating discomfort)  Work on weight loss and increasing  your activity .  Follow up with Dr [name] in 6 weeks.  We will call to set this up with you.    YOU HAD AN ENDOSCOPIC PROCEDURE TODAY AT [facility]:   Refer to the procedure report that was given to you for any specific questions about what was found during the examination.  If the procedure report does not answer your questions, please call your gastroenterologist to clarify.  If you requested that your care partner not be given the details of your procedure findings, then the procedure report has been included in a sealed envelope for you to review at your convenience later.  YOU SHOULD EXPECT: Some feelings of bloating in the abdomen. Passage of more gas than usual.  Walking can help get rid of the air that was put into your GI tract during the procedure and reduce the bloating. If you had a lower endoscopy (such as a colonoscopy or flexible sigmoidoscopy) you may notice spotting of blood in your stool or on the toilet paper. If you underwent a bowel prep for your procedure, you may not have a normal bowel movement for a few days.  Please Note:  You might notice some irritation and congestion in your nose or some drainage.  This is from the oxygen used during your procedure.  There is no need for concern and it should clear up in a day or so.  SYMPTOMS TO REPORT IMMEDIATELY:   Following lower endoscopy (colonoscopy or flexible sigmoidoscopy):  Excessive amounts of blood in the stool  Significant tenderness or worsening of abdominal pains  Swelling of the abdomen that is new,  acute  Fever of 100F or higher   For urgent or emergent issues, a gastroenterologist can be reached at any hour by calling [phone].   DIET:  We do recommend a small meal at first, but then you may proceed to your regular diet.  Drink plenty of fluids but you should avoid alcoholic beverages for 24 hours.  ACTIVITY:  You should plan to take it easy for the rest of today and you should NOT DRIVE or use heavy machinery until tomorrow (because of the sedation medicines used during the test).    FOLLOW UP: Our staff will call the number listed on your records 48-72 hours following your procedure to check on you and address any questions or concerns that you may have regarding the information given to you following your procedure. If we do not reach you, we will leave a message.  We will attempt to reach you two times.  During this call, we will ask if you have developed any symptoms of COVID 19. If you develop any symptoms (ie: fever, flu-like symptoms, shortness of breath, cough etc.) before then, please call [phone].  If you test positive for Covid 19 in the 2 weeks post procedure, please call and report this information to [place].    If any biopsies were taken you will be contacted by phone or by letter within the next 1-3 weeks.  Please call us  at [phone] if you have not heard about the biopsies in 3 weeks.    SIGNATURES/CONFIDENTIALITY: You and/or your care partner have signed paperwork which will be entered into your electronic medical record.  These signatures attest to the fact that that the information above on your After Visit Summary has been reviewed and is understood.  Full responsibility of the confidentiality of this discharge information lies with you and/or your care-partner.

## 2019-04-07 NOTE — Progress Notes (Signed)
Called to room to assist during endoscopic procedure.  Patient ID and intended procedure confirmed with present staff. Received instructions for my participation in the procedure from the performing physician.  

## 2019-04-07 NOTE — Progress Notes (Signed)
Report to PACU, RN, vss, BBS= Clear.  

## 2019-04-07 NOTE — Op Note (Signed)
[name]: Francisco Russell Procedure Date: 04/07/2019 10:30 AM MRN: [id number] Endoscopist: Docia Chuck. 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 Diagnosed with pan ulcerative colitis                            remotely. Last colonoscopy about 3 years ago was                            grossly normal with microscopic chronic disease in                            the right colon and rectum. Recently seen in the                            office after a long hiatus with possible flare and                            colitis. Was placed on high-dose prednisone and                            restarted on 6-mercaptopurine. The patient                            decreased his prednisone from 20 mg daily when he                            was seen on October 20-second to 10 mg daily 1 week                            later. He remains on 10 mg daily. He states that                            his symptoms have improved. Complaining of                            persistent bloating and gas Medicines:                Monitored Anesthesia Care Procedure:                Pre-Anesthesia Assessment:                           - Prior to the procedure, a History and Physical  was performed, and patient medications and                            allergies were reviewed. The patient's tolerance of                            previous anesthesia was also reviewed. The risks                            and benefits of the procedure and the sedation                            options and risks were discussed with the patient.                            All questions were answered, and informed  consent                            was obtained. Prior Anticoagulants: The patient has                            taken no previous anticoagulant or antiplatelet                            agents. ASA Grade Assessment: II - A patient with                            mild systemic disease. After reviewing the risks                            and benefits, the patient was deemed in                            satisfactory condition to undergo the procedure.                           After obtaining informed consent, the colonoscope                            was passed under direct vision. Throughout the                            procedure, the patient's blood pressure, pulse, and                            oxygen saturations were monitored continuously. The                            Colonoscope was introduced through the anus and                            advanced to the the cecum, identified by  appendiceal orifice and ileocecal valve. The                            ileocecal valve, appendiceal orifice, and rectum                            were photographed. The quality of the bowel                            preparation was excellent. The colonoscopy was                            performed without difficulty. The patient tolerated                            the procedure well. The bowel preparation used was                            SUPREP. Scope In: 10:42:43 AM Scope Out: 10:59:20 AM Scope Withdrawal Time: 0 hours 15 minutes 3 seconds  Total Procedure Duration: 0 hours 16 minutes 37 seconds  Findings:                 Two polyps were found in the ascending colon. The                            polyps were 3 mm in size. These polyps were removed                            with a cold snare. Resection and retrieval were                            complete.                           A few small-mouthed diverticula were found in the                             sigmoid colon.                           The entire examined colon appeared normal on direct                            and retroflexion views. Four biopsies were taken                            every 10 cm with a cold forceps from the entire                            colon for ulcerative colitis surveillance. These                            biopsy specimens were sent to Pathology. Complications:  No immediate complications. Estimated blood loss:                            None. Estimated Blood Loss:     Estimated blood loss: none. Impression:               - Two 3 mm polyps in the ascending colon, removed                            with a cold snare. Resected and retrieved.                           - Diverticulosis in the sigmoid colon.                           -Possibly mild erythema of the cecum which was                            biopsied alone. The entire examined colon was                            otherwise normal normal on direct and retroflexion                            views. No endoscopicly active colitis. Recommendation:           1. Decrease prednisone to 10 mg every other day for                            1 week then stop                           2. Continue 6-mercaptopurine daily without change                           3. Prescribe Xifaxan 550 mg 3 times daily; #21; 1                            refill (this is for chronic bloating discomfort)                           4. Exercise and weight loss                           5. Follow-up pathology                           6. Office follow-up with Dr. [name] in 4-6 weeks Docia Chuck. [name], MD 04/07/2019 11:10:08 AM This report has been signed electronically.

## 2019-04-11 ENCOUNTER — Telehealth: Payer: Self-pay | Admitting: *Deleted

## 2019-04-11 NOTE — Telephone Encounter (Signed)
Follow up call made, left message.

## 2019-04-11 NOTE — Telephone Encounter (Signed)
Message left

## 2019-04-12 ENCOUNTER — Encounter: Payer: Self-pay | Admitting: Internal Medicine

## 2019-04-21 ENCOUNTER — Other Ambulatory Visit: Payer: Self-pay | Admitting: Physician Assistant

## 2019-05-10 DIAGNOSIS — H9203 Otalgia, bilateral: Secondary | ICD-10-CM | POA: Diagnosis not present

## 2019-05-23 ENCOUNTER — Ambulatory Visit: Payer: BC Managed Care – PPO | Admitting: Internal Medicine

## 2019-06-05 DIAGNOSIS — M542 Cervicalgia: Secondary | ICD-10-CM | POA: Diagnosis not present

## 2019-06-05 DIAGNOSIS — M545 Low back pain: Secondary | ICD-10-CM | POA: Diagnosis not present

## 2019-07-10 DIAGNOSIS — Z6841 Body Mass Index (BMI) 40.0 and over, adult: Secondary | ICD-10-CM | POA: Diagnosis not present

## 2019-07-10 DIAGNOSIS — M545 Low back pain: Secondary | ICD-10-CM | POA: Diagnosis not present

## 2019-07-14 DIAGNOSIS — Z6841 Body Mass Index (BMI) 40.0 and over, adult: Secondary | ICD-10-CM | POA: Diagnosis not present

## 2019-07-14 DIAGNOSIS — M47816 Spondylosis without myelopathy or radiculopathy, lumbar region: Secondary | ICD-10-CM | POA: Diagnosis not present

## 2019-07-19 DIAGNOSIS — M2550 Pain in unspecified joint: Secondary | ICD-10-CM | POA: Diagnosis not present

## 2019-07-19 DIAGNOSIS — K5100 Ulcerative (chronic) pancolitis without complications: Secondary | ICD-10-CM | POA: Diagnosis not present

## 2019-07-19 DIAGNOSIS — M545 Low back pain: Secondary | ICD-10-CM | POA: Diagnosis not present

## 2019-07-19 DIAGNOSIS — M542 Cervicalgia: Secondary | ICD-10-CM | POA: Diagnosis not present

## 2019-07-19 DIAGNOSIS — M150 Primary generalized (osteo)arthritis: Secondary | ICD-10-CM | POA: Diagnosis not present

## 2019-07-20 DIAGNOSIS — M47816 Spondylosis without myelopathy or radiculopathy, lumbar region: Secondary | ICD-10-CM | POA: Diagnosis not present

## 2019-08-03 DIAGNOSIS — M47816 Spondylosis without myelopathy or radiculopathy, lumbar region: Secondary | ICD-10-CM | POA: Diagnosis not present

## 2019-09-20 DIAGNOSIS — K5190 Ulcerative colitis, unspecified, without complications: Secondary | ICD-10-CM | POA: Diagnosis not present

## 2019-09-20 DIAGNOSIS — E781 Pure hyperglyceridemia: Secondary | ICD-10-CM | POA: Diagnosis not present

## 2019-09-20 DIAGNOSIS — I10 Essential (primary) hypertension: Secondary | ICD-10-CM | POA: Diagnosis not present

## 2019-09-20 DIAGNOSIS — Z125 Encounter for screening for malignant neoplasm of prostate: Secondary | ICD-10-CM | POA: Diagnosis not present

## 2021-04-08 ENCOUNTER — Other Ambulatory Visit: Payer: Self-pay | Admitting: Neurological Surgery

## 2021-04-08 DIAGNOSIS — M48062 Spinal stenosis, lumbar region with neurogenic claudication: Secondary | ICD-10-CM

## 2021-05-01 ENCOUNTER — Ambulatory Visit
Admission: RE | Admit: 2021-05-01 | Discharge: 2021-05-01 | Disposition: A | Discharge status: Home / Self Care | Payer: BC Managed Care – PPO | Source: Ambulatory Visit | Attending: Neurological Surgery | Admitting: Neurological Surgery

## 2021-05-01 DIAGNOSIS — M48062 Spinal stenosis, lumbar region with neurogenic claudication: Secondary | ICD-10-CM

## 2021-05-09 DIAGNOSIS — Z6841 Body Mass Index (BMI) 40.0 and over, adult: Secondary | ICD-10-CM | POA: Diagnosis not present

## 2021-05-09 DIAGNOSIS — M47816 Spondylosis without myelopathy or radiculopathy, lumbar region: Secondary | ICD-10-CM | POA: Diagnosis not present

## 2021-05-09 DIAGNOSIS — I10 Essential (primary) hypertension: Secondary | ICD-10-CM | POA: Diagnosis not present

## 2021-06-20 ENCOUNTER — Ambulatory Visit
Admission: RE | Admit: 2021-06-20 | Discharge: 2021-06-20 | Disposition: A | Discharge status: Home / Self Care | Payer: BC Managed Care – PPO | Source: Ambulatory Visit | Attending: Physician Assistant | Admitting: Physician Assistant

## 2021-06-20 ENCOUNTER — Other Ambulatory Visit: Payer: Self-pay | Admitting: Physician Assistant

## 2021-06-20 DIAGNOSIS — M7989 Other specified soft tissue disorders: Secondary | ICD-10-CM | POA: Diagnosis not present

## 2021-06-20 DIAGNOSIS — W540XXA Bitten by dog, initial encounter: Secondary | ICD-10-CM | POA: Diagnosis not present

## 2021-06-20 DIAGNOSIS — S61459A Open bite of unspecified hand, initial encounter: Secondary | ICD-10-CM | POA: Diagnosis not present

## 2021-06-20 DIAGNOSIS — S61451A Open bite of right hand, initial encounter: Secondary | ICD-10-CM

## 2021-06-20 DIAGNOSIS — M79641 Pain in right hand: Secondary | ICD-10-CM | POA: Diagnosis not present

## 2021-06-20 DIAGNOSIS — Z23 Encounter for immunization: Secondary | ICD-10-CM | POA: Diagnosis not present

## 2021-06-20 DIAGNOSIS — K5190 Ulcerative colitis, unspecified, without complications: Secondary | ICD-10-CM | POA: Diagnosis not present

## 2021-08-13 DIAGNOSIS — G4730 Sleep apnea, unspecified: Secondary | ICD-10-CM | POA: Diagnosis not present

## 2021-08-13 DIAGNOSIS — R7303 Prediabetes: Secondary | ICD-10-CM | POA: Diagnosis not present

## 2021-08-13 DIAGNOSIS — I10 Essential (primary) hypertension: Secondary | ICD-10-CM | POA: Diagnosis not present

## 2021-08-13 DIAGNOSIS — K5190 Ulcerative colitis, unspecified, without complications: Secondary | ICD-10-CM | POA: Diagnosis not present

## 2021-08-13 DIAGNOSIS — E781 Pure hyperglyceridemia: Secondary | ICD-10-CM | POA: Diagnosis not present

## 2021-09-30 DIAGNOSIS — M47819 Spondylosis without myelopathy or radiculopathy, site unspecified: Secondary | ICD-10-CM | POA: Diagnosis not present

## 2022-03-19 DIAGNOSIS — Z125 Encounter for screening for malignant neoplasm of prostate: Secondary | ICD-10-CM | POA: Diagnosis not present

## 2022-03-19 DIAGNOSIS — R7303 Prediabetes: Secondary | ICD-10-CM | POA: Diagnosis not present

## 2022-03-19 DIAGNOSIS — E781 Pure hyperglyceridemia: Secondary | ICD-10-CM | POA: Diagnosis not present

## 2022-03-19 DIAGNOSIS — I10 Essential (primary) hypertension: Secondary | ICD-10-CM | POA: Diagnosis not present

## 2022-03-19 DIAGNOSIS — Z0000 Encounter for general adult medical examination without abnormal findings: Secondary | ICD-10-CM | POA: Diagnosis not present

## 2022-04-29 DIAGNOSIS — M47817 Spondylosis without myelopathy or radiculopathy, lumbosacral region: Secondary | ICD-10-CM | POA: Diagnosis not present

## 2022-05-27 DIAGNOSIS — M47817 Spondylosis without myelopathy or radiculopathy, lumbosacral region: Secondary | ICD-10-CM | POA: Diagnosis not present

## 2022-08-03 DIAGNOSIS — M533 Sacrococcygeal disorders, not elsewhere classified: Secondary | ICD-10-CM | POA: Diagnosis not present

## 2022-10-05 DIAGNOSIS — M48061 Spinal stenosis, lumbar region without neurogenic claudication: Secondary | ICD-10-CM | POA: Diagnosis not present

## 2022-10-08 DIAGNOSIS — M5416 Radiculopathy, lumbar region: Secondary | ICD-10-CM | POA: Diagnosis not present

## 2022-10-26 ENCOUNTER — Other Ambulatory Visit: Payer: Self-pay | Admitting: Physical Medicine & Rehabilitation

## 2022-10-26 DIAGNOSIS — M5416 Radiculopathy, lumbar region: Secondary | ICD-10-CM

## 2022-10-26 DIAGNOSIS — M48061 Spinal stenosis, lumbar region without neurogenic claudication: Secondary | ICD-10-CM | POA: Diagnosis not present

## 2022-10-26 DIAGNOSIS — Z79891 Long term (current) use of opiate analgesic: Secondary | ICD-10-CM | POA: Diagnosis not present

## 2022-11-15 ENCOUNTER — Ambulatory Visit
Admission: RE | Admit: 2022-11-15 | Discharge: 2022-11-15 | Disposition: A | Discharge status: Home / Self Care | Payer: BC Managed Care – PPO | Source: Ambulatory Visit | Attending: Physical Medicine & Rehabilitation | Admitting: Physical Medicine & Rehabilitation

## 2022-11-15 DIAGNOSIS — M5416 Radiculopathy, lumbar region: Secondary | ICD-10-CM | POA: Diagnosis not present

## 2022-11-23 DIAGNOSIS — M48061 Spinal stenosis, lumbar region without neurogenic claudication: Secondary | ICD-10-CM | POA: Diagnosis not present

## 2022-11-26 DIAGNOSIS — R7303 Prediabetes: Secondary | ICD-10-CM | POA: Diagnosis not present

## 2022-11-26 DIAGNOSIS — Z713 Dietary counseling and surveillance: Secondary | ICD-10-CM | POA: Diagnosis not present

## 2022-11-26 DIAGNOSIS — Z6841 Body Mass Index (BMI) 40.0 and over, adult: Secondary | ICD-10-CM | POA: Diagnosis not present

## 2022-12-11 DIAGNOSIS — Z6841 Body Mass Index (BMI) 40.0 and over, adult: Secondary | ICD-10-CM | POA: Diagnosis not present

## 2022-12-11 DIAGNOSIS — M47816 Spondylosis without myelopathy or radiculopathy, lumbar region: Secondary | ICD-10-CM | POA: Diagnosis not present

## 2022-12-21 DIAGNOSIS — Z79891 Long term (current) use of opiate analgesic: Secondary | ICD-10-CM | POA: Diagnosis not present

## 2022-12-21 DIAGNOSIS — M48061 Spinal stenosis, lumbar region without neurogenic claudication: Secondary | ICD-10-CM | POA: Diagnosis not present

## 2022-12-29 DIAGNOSIS — M6281 Muscle weakness (generalized): Secondary | ICD-10-CM | POA: Diagnosis not present

## 2022-12-29 DIAGNOSIS — R293 Abnormal posture: Secondary | ICD-10-CM | POA: Diagnosis not present

## 2022-12-29 DIAGNOSIS — M4800 Spinal stenosis, site unspecified: Secondary | ICD-10-CM | POA: Diagnosis not present

## 2022-12-29 DIAGNOSIS — M545 Low back pain, unspecified: Secondary | ICD-10-CM | POA: Diagnosis not present

## 2023-01-01 DIAGNOSIS — M6281 Muscle weakness (generalized): Secondary | ICD-10-CM | POA: Diagnosis not present

## 2023-01-01 DIAGNOSIS — M4800 Spinal stenosis, site unspecified: Secondary | ICD-10-CM | POA: Diagnosis not present

## 2023-01-01 DIAGNOSIS — M545 Low back pain, unspecified: Secondary | ICD-10-CM | POA: Diagnosis not present

## 2023-01-01 DIAGNOSIS — R293 Abnormal posture: Secondary | ICD-10-CM | POA: Diagnosis not present

## 2023-01-05 DIAGNOSIS — M6281 Muscle weakness (generalized): Secondary | ICD-10-CM | POA: Diagnosis not present

## 2023-01-05 DIAGNOSIS — M545 Low back pain, unspecified: Secondary | ICD-10-CM | POA: Diagnosis not present

## 2023-01-05 DIAGNOSIS — M4800 Spinal stenosis, site unspecified: Secondary | ICD-10-CM | POA: Diagnosis not present

## 2023-01-05 DIAGNOSIS — R293 Abnormal posture: Secondary | ICD-10-CM | POA: Diagnosis not present

## 2023-01-06 DIAGNOSIS — M4800 Spinal stenosis, site unspecified: Secondary | ICD-10-CM | POA: Diagnosis not present

## 2023-01-06 DIAGNOSIS — R293 Abnormal posture: Secondary | ICD-10-CM | POA: Diagnosis not present

## 2023-01-06 DIAGNOSIS — M545 Low back pain, unspecified: Secondary | ICD-10-CM | POA: Diagnosis not present

## 2023-01-06 DIAGNOSIS — M6281 Muscle weakness (generalized): Secondary | ICD-10-CM | POA: Diagnosis not present

## 2023-01-07 DIAGNOSIS — M791 Myalgia, unspecified site: Secondary | ICD-10-CM | POA: Diagnosis not present

## 2023-01-07 DIAGNOSIS — M47816 Spondylosis without myelopathy or radiculopathy, lumbar region: Secondary | ICD-10-CM | POA: Diagnosis not present

## 2023-01-11 DIAGNOSIS — M48061 Spinal stenosis, lumbar region without neurogenic claudication: Secondary | ICD-10-CM | POA: Diagnosis not present

## 2023-01-25 DIAGNOSIS — M545 Low back pain, unspecified: Secondary | ICD-10-CM | POA: Diagnosis not present

## 2023-01-25 DIAGNOSIS — R293 Abnormal posture: Secondary | ICD-10-CM | POA: Diagnosis not present

## 2023-01-25 DIAGNOSIS — M6281 Muscle weakness (generalized): Secondary | ICD-10-CM | POA: Diagnosis not present

## 2023-01-25 DIAGNOSIS — M4800 Spinal stenosis, site unspecified: Secondary | ICD-10-CM | POA: Diagnosis not present

## 2023-01-27 DIAGNOSIS — R293 Abnormal posture: Secondary | ICD-10-CM | POA: Diagnosis not present

## 2023-01-27 DIAGNOSIS — M545 Low back pain, unspecified: Secondary | ICD-10-CM | POA: Diagnosis not present

## 2023-01-27 DIAGNOSIS — M6281 Muscle weakness (generalized): Secondary | ICD-10-CM | POA: Diagnosis not present

## 2023-01-27 DIAGNOSIS — M4800 Spinal stenosis, site unspecified: Secondary | ICD-10-CM | POA: Diagnosis not present

## 2023-02-03 DIAGNOSIS — M4800 Spinal stenosis, site unspecified: Secondary | ICD-10-CM | POA: Diagnosis not present

## 2023-02-03 DIAGNOSIS — M6281 Muscle weakness (generalized): Secondary | ICD-10-CM | POA: Diagnosis not present

## 2023-02-03 DIAGNOSIS — M545 Low back pain, unspecified: Secondary | ICD-10-CM | POA: Diagnosis not present

## 2023-02-03 DIAGNOSIS — R293 Abnormal posture: Secondary | ICD-10-CM | POA: Diagnosis not present

## 2023-02-08 DIAGNOSIS — M48061 Spinal stenosis, lumbar region without neurogenic claudication: Secondary | ICD-10-CM | POA: Diagnosis not present

## 2023-02-13 DIAGNOSIS — Z23 Encounter for immunization: Secondary | ICD-10-CM | POA: Diagnosis not present

## 2023-03-03 DIAGNOSIS — R293 Abnormal posture: Secondary | ICD-10-CM | POA: Diagnosis not present

## 2023-03-03 DIAGNOSIS — M6281 Muscle weakness (generalized): Secondary | ICD-10-CM | POA: Diagnosis not present

## 2023-03-03 DIAGNOSIS — M4800 Spinal stenosis, site unspecified: Secondary | ICD-10-CM | POA: Diagnosis not present

## 2023-03-03 DIAGNOSIS — M545 Low back pain, unspecified: Secondary | ICD-10-CM | POA: Diagnosis not present

## 2023-03-08 DIAGNOSIS — M4800 Spinal stenosis, site unspecified: Secondary | ICD-10-CM | POA: Diagnosis not present

## 2023-03-08 DIAGNOSIS — M6281 Muscle weakness (generalized): Secondary | ICD-10-CM | POA: Diagnosis not present

## 2023-03-08 DIAGNOSIS — R293 Abnormal posture: Secondary | ICD-10-CM | POA: Diagnosis not present

## 2023-03-08 DIAGNOSIS — M545 Low back pain, unspecified: Secondary | ICD-10-CM | POA: Diagnosis not present

## 2023-03-09 DIAGNOSIS — M48061 Spinal stenosis, lumbar region without neurogenic claudication: Secondary | ICD-10-CM | POA: Diagnosis not present

## 2023-03-10 DIAGNOSIS — M4800 Spinal stenosis, site unspecified: Secondary | ICD-10-CM | POA: Diagnosis not present

## 2023-03-10 DIAGNOSIS — M545 Low back pain, unspecified: Secondary | ICD-10-CM | POA: Diagnosis not present

## 2023-03-10 DIAGNOSIS — M6281 Muscle weakness (generalized): Secondary | ICD-10-CM | POA: Diagnosis not present

## 2023-03-10 DIAGNOSIS — R293 Abnormal posture: Secondary | ICD-10-CM | POA: Diagnosis not present

## 2023-03-17 DIAGNOSIS — M6281 Muscle weakness (generalized): Secondary | ICD-10-CM | POA: Diagnosis not present

## 2023-03-17 DIAGNOSIS — R293 Abnormal posture: Secondary | ICD-10-CM | POA: Diagnosis not present

## 2023-03-17 DIAGNOSIS — M545 Low back pain, unspecified: Secondary | ICD-10-CM | POA: Diagnosis not present

## 2023-03-17 DIAGNOSIS — M4800 Spinal stenosis, site unspecified: Secondary | ICD-10-CM | POA: Diagnosis not present

## 2023-03-22 DIAGNOSIS — I10 Essential (primary) hypertension: Secondary | ICD-10-CM | POA: Diagnosis not present

## 2023-03-22 DIAGNOSIS — E781 Pure hyperglyceridemia: Secondary | ICD-10-CM | POA: Diagnosis not present

## 2023-03-22 DIAGNOSIS — Z0000 Encounter for general adult medical examination without abnormal findings: Secondary | ICD-10-CM | POA: Diagnosis not present

## 2023-03-22 DIAGNOSIS — R7303 Prediabetes: Secondary | ICD-10-CM | POA: Diagnosis not present

## 2023-03-22 DIAGNOSIS — Z1159 Encounter for screening for other viral diseases: Secondary | ICD-10-CM | POA: Diagnosis not present

## 2023-03-22 DIAGNOSIS — R350 Frequency of micturition: Secondary | ICD-10-CM | POA: Diagnosis not present

## 2023-03-22 DIAGNOSIS — K5190 Ulcerative colitis, unspecified, without complications: Secondary | ICD-10-CM | POA: Diagnosis not present

## 2023-03-22 DIAGNOSIS — Z125 Encounter for screening for malignant neoplasm of prostate: Secondary | ICD-10-CM | POA: Diagnosis not present

## 2023-03-22 DIAGNOSIS — E291 Testicular hypofunction: Secondary | ICD-10-CM | POA: Diagnosis not present

## 2023-03-24 DIAGNOSIS — M545 Low back pain, unspecified: Secondary | ICD-10-CM | POA: Diagnosis not present

## 2023-03-24 DIAGNOSIS — M4800 Spinal stenosis, site unspecified: Secondary | ICD-10-CM | POA: Diagnosis not present

## 2023-03-24 DIAGNOSIS — R293 Abnormal posture: Secondary | ICD-10-CM | POA: Diagnosis not present

## 2023-03-24 DIAGNOSIS — M6281 Muscle weakness (generalized): Secondary | ICD-10-CM | POA: Diagnosis not present

## 2023-03-31 DIAGNOSIS — R293 Abnormal posture: Secondary | ICD-10-CM | POA: Diagnosis not present

## 2023-03-31 DIAGNOSIS — M545 Low back pain, unspecified: Secondary | ICD-10-CM | POA: Diagnosis not present

## 2023-03-31 DIAGNOSIS — M6281 Muscle weakness (generalized): Secondary | ICD-10-CM | POA: Diagnosis not present

## 2023-03-31 DIAGNOSIS — M4800 Spinal stenosis, site unspecified: Secondary | ICD-10-CM | POA: Diagnosis not present

## 2023-04-07 DIAGNOSIS — M4800 Spinal stenosis, site unspecified: Secondary | ICD-10-CM | POA: Diagnosis not present

## 2023-04-07 DIAGNOSIS — M6281 Muscle weakness (generalized): Secondary | ICD-10-CM | POA: Diagnosis not present

## 2023-04-07 DIAGNOSIS — R293 Abnormal posture: Secondary | ICD-10-CM | POA: Diagnosis not present

## 2023-04-07 DIAGNOSIS — M545 Low back pain, unspecified: Secondary | ICD-10-CM | POA: Diagnosis not present

## 2023-04-13 DIAGNOSIS — M6281 Muscle weakness (generalized): Secondary | ICD-10-CM | POA: Diagnosis not present

## 2023-04-13 DIAGNOSIS — M48061 Spinal stenosis, lumbar region without neurogenic claudication: Secondary | ICD-10-CM | POA: Diagnosis not present

## 2023-04-13 DIAGNOSIS — M545 Low back pain, unspecified: Secondary | ICD-10-CM | POA: Diagnosis not present

## 2023-04-13 DIAGNOSIS — R293 Abnormal posture: Secondary | ICD-10-CM | POA: Diagnosis not present

## 2023-04-13 DIAGNOSIS — M4800 Spinal stenosis, site unspecified: Secondary | ICD-10-CM | POA: Diagnosis not present

## 2023-04-22 DIAGNOSIS — M4800 Spinal stenosis, site unspecified: Secondary | ICD-10-CM | POA: Diagnosis not present

## 2023-04-22 DIAGNOSIS — M545 Low back pain, unspecified: Secondary | ICD-10-CM | POA: Diagnosis not present

## 2023-04-22 DIAGNOSIS — M6281 Muscle weakness (generalized): Secondary | ICD-10-CM | POA: Diagnosis not present

## 2023-04-22 DIAGNOSIS — R293 Abnormal posture: Secondary | ICD-10-CM | POA: Diagnosis not present

## 2023-09-30 ENCOUNTER — Other Ambulatory Visit: Payer: Self-pay

## 2023-09-30 MED ORDER — MOUNJARO 5 MG/0.5ML ~~LOC~~ SOAJ
5.0000 mg | SUBCUTANEOUS | 0 refills | Status: DC
  Filled 2023-09-30 – 2023-10-26 (×2): qty 2, 28d supply, fill #0

## 2023-10-06 ENCOUNTER — Other Ambulatory Visit: Payer: Self-pay

## 2023-10-06 MED ORDER — GABAPENTIN 100 MG PO CAPS
ORAL_CAPSULE | ORAL | 0 refills | Status: AC
  Filled 2023-10-06: qty 30, 15d supply, fill #0
  Filled 2023-10-26: qty 30, 10d supply, fill #0

## 2023-10-15 ENCOUNTER — Other Ambulatory Visit: Payer: Self-pay

## 2023-10-26 ENCOUNTER — Other Ambulatory Visit: Payer: Self-pay

## 2023-11-29 ENCOUNTER — Other Ambulatory Visit: Payer: Self-pay

## 2023-11-29 MED ORDER — MOUNJARO 5 MG/0.5ML ~~LOC~~ SOAJ
5.0000 mg | SUBCUTANEOUS | 1 refills | Status: AC
  Filled 2023-11-29: qty 2, 28d supply, fill #0
  Filled 2023-12-24: qty 6, 84d supply, fill #1

## 2023-11-29 MED ORDER — MOUNJARO 5 MG/0.5ML ~~LOC~~ SOAJ
5.0000 mg | SUBCUTANEOUS | 1 refills | Status: DC
  Filled 2023-11-29: qty 4, 56d supply, fill #0

## 2023-11-30 ENCOUNTER — Other Ambulatory Visit: Payer: Self-pay

## 2023-12-24 ENCOUNTER — Other Ambulatory Visit: Payer: Self-pay

## 2024-04-12 ENCOUNTER — Telehealth: Payer: Self-pay | Admitting: Neurology

## 2024-04-12 NOTE — Telephone Encounter (Signed)
 Request to cancel appointment, pt no longer wants it

## 2024-04-20 ENCOUNTER — Ambulatory Visit: Admitting: Neurology
